# Patient Record
Sex: Female | Born: 1960 | Race: Black or African American | Hispanic: No | Marital: Single | Smoking: Never smoker
Health system: Southern US, Community
[De-identification: ages and names within clinical notes are randomized; demographics above are authoritative.]

## PROBLEM LIST (undated history)

## (undated) DIAGNOSIS — M1990 Unspecified osteoarthritis, unspecified site: Secondary | ICD-10-CM

## (undated) DIAGNOSIS — I10 Essential (primary) hypertension: Secondary | ICD-10-CM

## (undated) DIAGNOSIS — F32A Depression, unspecified: Secondary | ICD-10-CM

## (undated) DIAGNOSIS — F329 Major depressive disorder, single episode, unspecified: Secondary | ICD-10-CM

## (undated) DIAGNOSIS — F419 Anxiety disorder, unspecified: Secondary | ICD-10-CM

## (undated) DIAGNOSIS — G8929 Other chronic pain: Secondary | ICD-10-CM

## (undated) DIAGNOSIS — H9190 Unspecified hearing loss, unspecified ear: Secondary | ICD-10-CM

## (undated) DIAGNOSIS — M25561 Pain in right knee: Secondary | ICD-10-CM

## (undated) HISTORY — PX: CHOLECYSTECTOMY: SHX55

## (undated) HISTORY — PX: HERNIA REPAIR: SHX51

## (undated) HISTORY — DX: Unspecified hearing loss, unspecified ear: H91.90

## (undated) HISTORY — PX: WISDOM TOOTH EXTRACTION: SHX21

## (undated) HISTORY — PX: UTERINE FIBROID SURGERY: SHX826

---

## 2000-07-04 ENCOUNTER — Ambulatory Visit (HOSPITAL_BASED_OUTPATIENT_CLINIC_OR_DEPARTMENT_OTHER): Admission: RE | Admit: 2000-07-04 | Discharge: 2000-07-04 | Payer: Self-pay | Admitting: Orthopedic Surgery

## 2002-08-12 ENCOUNTER — Other Ambulatory Visit: Admission: RE | Admit: 2002-08-12 | Discharge: 2002-08-12 | Payer: Self-pay | Admitting: Obstetrics & Gynecology

## 2006-04-14 ENCOUNTER — Emergency Department (HOSPITAL_COMMUNITY): Admission: EM | Admit: 2006-04-14 | Discharge: 2006-04-14 | Payer: Self-pay | Admitting: Family Medicine

## 2006-04-19 ENCOUNTER — Emergency Department (HOSPITAL_COMMUNITY): Admission: EM | Admit: 2006-04-19 | Discharge: 2006-04-19 | Payer: Self-pay | Admitting: Family Medicine

## 2006-05-14 HISTORY — PX: ABDOMINAL HYSTERECTOMY: SHX81

## 2006-06-05 ENCOUNTER — Ambulatory Visit (HOSPITAL_COMMUNITY): Admission: RE | Admit: 2006-06-05 | Discharge: 2006-06-05 | Payer: Self-pay | Admitting: Obstetrics and Gynecology

## 2006-07-30 ENCOUNTER — Inpatient Hospital Stay (HOSPITAL_COMMUNITY): Admission: RE | Admit: 2006-07-30 | Discharge: 2006-08-04 | Payer: Self-pay | Admitting: General Surgery

## 2006-07-30 ENCOUNTER — Encounter (INDEPENDENT_AMBULATORY_CARE_PROVIDER_SITE_OTHER): Payer: Self-pay | Admitting: Specialist

## 2007-02-04 ENCOUNTER — Encounter: Admission: RE | Admit: 2007-02-04 | Discharge: 2007-02-04 | Payer: Self-pay | Admitting: General Surgery

## 2007-07-30 ENCOUNTER — Encounter: Admission: RE | Admit: 2007-07-30 | Discharge: 2007-07-30 | Payer: Self-pay | Admitting: General Surgery

## 2010-09-29 NOTE — H&P (Signed)
[name], [name]              ACCOUNT NO.:  [id number]   MEDICAL RECORD NO.:  [id number]        PATIENT TYPE:  LINP   LOCATION:                               FACILITY:  [facility]   PHYSICIAN:  Dineen Kid. Rana Snare, M.D.    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  She also has an abdominal mass and also gallbladder  disease and is followed by Dr. Abbey Chatters.  She had originally been  referred to me in December, 2007 for original evaluation of large  fibroids and an ovarian cyst found on ultrasound.  CAT scan as well as  ultrasounds have shown a 10-12 week size fibroid uterus.  Her left ovary  measured 5.7 cm in size, which is suspicious for dermoid tumor; however,  she also has a 10 cm complex cystic mass to the right of the umbilicus,  which we do not think is connected to the ovary, which Dr. 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  Her  pain has been  more from the umbilical cyst.  She had a normal CA-125.  Has no further childbearing desires.  Presents for a hysterectomy with  removal of both tubes and ovaries as well as removal of the abdominal  mass and also gallbladder by Dr. Abbey Chatters.   PAST MEDICAL HISTORY:  Significant for being deaf and mute.  She is very  adept at sign language.   PAST SURGICAL HISTORY:  Significant for wrist surgery and also wisdom  teeth.   OB HISTORY:  She has had a vaginal delivery of a 6 pound baby.   She reports an allergy to PENICILLIN as a child, for which she had a  rash.  Her mom and [name] both think she can take amoxicillin without  problems.   PHYSICAL EXAMINATION:  VITAL SIGNS:  Her  blood pressure today was  elevated at 152/96.  Her weight is 282.  HEART: Regular rate and rhythm.  LUNGS:  Clear to auscultation bilaterally.  ABDOMEN:  Obese.  She has a 10 cm mass at the right of the midline.  It  feels like an umbilical hernia.  According to the CAT scan, it most  likely represents urachal cyst.  Mildly tender to deep palpation.  PELVIC:  The pelvic exam is somewhat limited due to her size.  She does  have normal external genitalia, Bartholin's, urethra, and Skene's.  Uterus is anteverted and slightly enlarged, approximately 8-10 weeks  size and nontender.   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  Discussed the procedures at length.  We  are going to proceed with the abdominal mass, first with Dr. Abbey Chatters,  and possible gallbladder first.  If it looks  feasible to proceed  laparoscopically, we will proceed with a laparoscopically assisted  vaginal hysterectomy with bilateral salpingo-oophorectomy.  If it does  not appear that we are going to be able to proceed laparoscopically,  then we will proceed with a total abdominal hysterectomy with bilateral  salpingo-oophorectomy.  I discussed the risks and benefits of the  procedure at length, which include but are  limited to, the risk of infection, bleeding, damage to the bowel,  bladder, ureters, risks associated with blood transfusion, anesthesia,  discussed recovery time, and also discussed hormone replacement therapy.  All of her questions were answered.  She gives her informed consent.      Dineen Kid Rana Snare, M.D.  Electronically Signed     DCL/MEDQ  D:  07/29/2006  T:  07/29/2006  Job:  [id number]

## 2010-09-29 NOTE — Op Note (Signed)
[name], [name]              ACCOUNT NO.:  [id number]   MEDICAL RECORD NO.:  [id number]          PATIENT TYPE:  INP   LOCATION:  1608                         FACILITY[facility]   PHYSICIAN:  Dineen Kid. Rana Snare, M.D.    DATE OF BIRTH:  08/21/1960   DATE OF PROCEDURE:  07/30/2006  DATE OF DISCHARGE:                               OPERATIVE REPORT   PREOPERATIVE DIAGNOSIS:  Menorrhagia, fibroids, pelvic pain, left  ovarian mass, abdominal mass, symptomatic cholelithiasis.   POSTOPERATIVE DIAGNOSIS:  Menorrhagia, fibroids, pelvic pain, left  ovarian mass, abdominal mass, symptomatic cholelithiasis, plus pelvic  abdominal adhesions.   SURGEON:  Dineen Kid. Rana Snare, M.D.   Mammie LorenzoAdolph Pollack, M.D.   INDICATIONS:  [name] is a 50 year old G1, P1 with worsening  abdominal pain, pelvic pain, menorrhagia.  She had a CAT scan which  shows a 10 cm abdominal mass, which she is complaining of abdominal  pain.  She also has symptomatic cholelithiasis but also has a left 5.5  cm right ovarian mass, consistent with a dermoid.  She has also had  menorrhagia and fibroids, approximately 10 weeks size.  She desires  surgical intervention for all of the above and presents for evaluation  and removal of abdominal mass, gallbladder, uterus, tubes, and ovaries.  The risks and benefits of the procedure were discussed at length.  See  history and physical for further details.   FINDINGS:  Abdominal mass, further dictation by Dr. Abbey Chatters, with  dense omental and bowel adhesions to the anterior abdominal wall.  Pelvis had a 10-week size fibroid uterus with adhesions from the omentum  and the large bowel to the posterior wall, left ovarian cyst consistent  with a dermoid, approximately 6 cm in size.  Ovary appears to be normal  other than adhesions to the pelvic side wall.  The right fallopian tube  appears to have a hydrosalpinx.  Multiple fibroids throughout the  uterus.   DESCRIPTION OF  PROCEDURE:  Laparoscopic cholecystectomy was carried out  and was dictated by Dr. Abbey Chatters.  I placed the laparoscope through  the 10 mm scope to the left of the midline.  I placed a 5 mm trocar to  the left of the midline and after careful evaluation of the pelvis,  realizing there were a significant amount of adhesions to the uterus  from the omentum to the pelvic side wall and the uterus was  significantly large enough that this would not be able to be completed  laparoscopically assisted vaginal.  Furthermore, because of the  abdominal mass significant enough in adhesions and adhesions, she was  going to require a midline incision.  We elected at this point to not  proceed with the LAVH but proceed with a midline incision and a total  abdominal hysterectomy.  A midline incision was reflected laterally  after the fascia was dissected.  The peritoneum was entered sharply.  Careful dissection of the abdominal wall mass was carried out and  dissection of the bowel and cecectomy.  These were dictated by Dr.  Abbey Chatters.  At this point, the patient was  placed in Trendelenburg.  A  Balfour retractor was placed.  The bowel was packed cephalad.  Meticulous dissection of the bowel and the omentum off the uterus  posterior cul de sac and the ovaries was carried out.  The left ovary  was grasped with a Babcock clamp.  The infundibulopelvic ligament was  identified.  A LigaSure instrument was used to ligate and dissect across  the infundibulopelvic ligament, across the round ligament, and the  superior portion of the broad ligament.  The right infundibulopelvic  ligament was identified, ligated and dissected using the LigaSure.  The  round ligament was identified, ligated, and the superior portion of the  broad ligament was identified and ligated.  The bladder was dissected  off the anterior cervix.  The LigaSure was placed across the uterine  vasculature bilaterally, ligated, and dissected.   Because of the  significant amount of fibroids, at this point the corpus was removed  from the cervix using a scalpel.  A Kocher tenaculum was placed on the  cervix.  The bladder was dissected off the anterior surface of the  cervix.  Heaney clamps were used to clamp across the cardinal ligaments  down to the uterosacral ligaments.  The LigaSure instrument was used to  ligate along these pedicles, dissect down to the corner of the vagina.  A Heaney clamp was placed across the corner of the vagina, and the  Satinsky scissors were used to remove the cervix.  Examination of the  cervix revealed the entire cervix removed.  The vagina was then closed  with figure-of-eight sutures of 0 Monocryl suture with a good  approximation and good hemostasis achieved.  Examination of the pedicles  did have some problems with hemostasis with venous bleeding.  The vein  was grasped and ligated with LigaSure.  Because of some generalized  oozing below the base of the bladder, __________  was used to achieve  good hemostasis at this point.  The ureters were identified manually  away from the pedicles, and good peristalsis was noted.  No blood was  noted in the urine.  Good urine had been produced.  After a copious  amount of irrigation, re-examination of the pelvis and cul de sac  revealed good hemostasis.  The abdomen was copiously irrigated, and the  midline incision and trocar incisions were closed and will be dictated  in Dr. Maris Berger operative note.  Patient tolerated the procedure  well and was stable on transfer to the recovery room.  The sponge and  instrument count was normal x3.  The patient received 900 mg of  Clindamycin preoperatively.  Operative procedure was laparoscopic  cholecystectomy, intraoperative cholangiogram, laparotomy with excision  of abdominal mass, lysis of adhesions, total abdominal hysterectomy, bilateral salpingo-oophorectomy, and cecectomy.  Estimated blood loss  was 1000  cc.      Dineen Kid Rana Snare, M.D.  Electronically Signed     DCL/MEDQ  D:  07/30/2006  T:  07/31/2006  Job:  [id number]

## 2010-09-29 NOTE — Op Note (Signed)
[name], RAIMER              ACCOUNT NO.:  [id number]   MEDICAL RECORD NO.:  [id number]          PATIENT TYPE:  INP   LOCATION:  1608                         FACILITY:  [facility]   PHYSICIAN:  Adolph Pollack, M.D.DATE OF BIRTH:  04-13-1961   DATE OF PROCEDURE:  07/30/2006  DATE OF DISCHARGE:                               OPERATIVE REPORT   PREOPERATIVE DIAGNOSES:  1. Menorrhagia with pelvic pain with left ovarian mass.  2. Symptomatic cholelithiasis.  3. Abdominal mass.   POSTOPERATIVE DIAGNOSES:  1. Menorrhagia with pelvic pain with left ovarian mass.  2. Symptomatic cholelithiasis.  3. Abdominal mass.   PROCEDURE:  1. Laparoscopic cholecystectomy with intraoperative cholangiogram.  2. Exploratory laparotomy with excision of abdominal mass and      cecectomy.  3. Total abdominal hysterectomy, bilateral salpingo-oophorectomy and      lysis of adhesions.   SURGEON:  Adolph Pollack, M.D.   COSURGEON:  Dr. Candice Camp.   ANESTHESIA:  General.   INDICATIONS:  This is a 50 year old female who has had an ovarian mass,  fibroids and abnormal uterine bleeding.  She has also had some right-  sided abdominal pain, was found to have gallstones and the cystic  abdominal mass.  She now presents for the above procedure.  We have gone  over the procedure and risks preoperatively.   TECHNIQUE:  She is seen in the holding area and brought to the operating  room, placed supine on the operating table and a general anesthetic was  administered.  A Foley catheter was inserted.  She is placed in the  lithotomy position.  The abdominal wall and perineal area were sterilely  prepped and draped.  I could palpate the abdominal mass in the  periumbilical area.  A small incision was made in the left upper  quadrant.  A 5 mm OptiVu trocar was used under laparoscopic vision.  I  was able to enter the peritoneal cavity using the OptiVu.  I immediately  inspected the area underneath the  trocar and no bleeding or bowel injury  was noted.  Pneumoperitoneum was then created by insufflation of CO2  gas.  The laparoscope was introduced and there were significant omental  adhesions around the periumbilical region as well as a cystic-appearing  mass.  In the epigastrium to the left of the midline a 10 mm trocar was  placed away from the abdominal mass.  A 10 mm trocar was placed in the  subxiphoid region and two 5 mm trocars were then placed in the right mid  abdomen and we directed our attention to the gallbladder.   The fundus of the gallbladder was grasped and retracted toward the right  shoulder.  No significant adhesions were noted.  The infundibulum was  then mobilized with blunt dissection and retracted laterally.  Using  careful blunt dissection the cystic artery was identified and was fairly  close to the cystic duct.  I created a window around both of these  structures.  The cystic artery was then clipped and divided.  A clip was  placed at the gallbladder cystic duct junction  and a small incision made  in the cystic duct.  A cholangiocatheter was passed through the anterior  abdominal wall placed into the cystic duct and cholangiogram was  performed.   Under real time fluoroscopy, dilute contrast was injected to the cystic  duct which was of moderate length.  The common hepatic, right and left  hepatic, common bile ducts all opacified and contrast drained promptly  into the duodenum with no obvious evidence of obstruction.  Final  reports pending radiologist's interpretation.   Cholangiocatheter was removed, the cystic duct was then clipped four  times proximally and divided.  I then dissected the gallbladder free  from the liver bed intact and identified a small posterior branch of the  cystic artery which was clipped.  The gallbladder was placed in  Endopouch bag.  The gallbladder fossa was irrigated and was hemostatic.  Irrigation fluid was evacuated.    Following this I began using Harmonic scalpel and dissect some of the  omental adhesions free from the abdominal wall and tried to mobilize  this abdominal mass that appeared be fairly adherent to the abdominal  wall.  I did this for approximately 30 minutes.  Dr. Rana Snare was going to  attempt to do a laparoscopic-assisted vaginal hysterectomy.  After 30  minutes I realized I probably would have to potentially open through a  small open incision.  So he attempted to do a laparoscopic-assisted  vaginal hysterectomy but she had some inflammatory changes in the pelvis  as well making it difficult.  We subsequently decided to open and do  exploratory laparotomy.   The CO2 gas was released.  I then made a lower midline incision and  extended this to the left of the umbilicus, dividing the skin and  subcutaneous tissue and fascia.  These the mass was then immediately  adherent to the posterior abdominal wall.  I carefully dissected the  mass off the posterior abdominal wall leaving the anterior fascial layer  but having to take some of the posterior fascia.  There are some small  bowel adhesions to the mass that I divided sharply.  It appeared be  somewhat of an inflammatory masses and there was significant  inflammatory reaction of the abdominal wall.  There were two small holes  placed in the mass and some brown slightly foul-smelling fluid was  evacuated and was sent for aerobic and anaerobic cultures.  I then  continued to excise the mass off the abdominal wall and then noted that  the mass was very close to the cecum.  I could not definitely find the  appendix and felt this may be involved with the mass.  Once I had  removed the mass, there was a small area where the appendiceal orifice  appeared.  I then dissected the terminal ileum away from the cecal area.  There was a firm area in the cecum which appeared to be inflammatory nature.  By using the GIA stapler, I performed a cecectomy  and  sent  this off the field.  Frozen section analysis of the cyst was consistent  with an inflammatory mass.   Following this I checked the ileocecal valve and it was patent.  Bleeding from the staple line was controlled with a suture.  Gloves were  then changed.   Dr. Rana Snare then performed lysis of pelvic adhesions and a TAH/BSO to be  dictated in his note.   After completion of all procedures, the abdominal cavity was copiously  irrigated with  saline solution.  I requested sponge count which was  reported be correct.  The midline fascia was then closed with a running  #1 Novofil suture.  The subcutaneous tissue was irrigated.  At this point, instrument and  sponge counts were reported to be correct again.  The skin incisions  were then closed with staples and sterile dressings were applied.   She tolerated procedure without any apparent complications and was taken  to recovery in satisfactory condition.      Adolph Pollack, M.D.  Electronically Signed     TJR/MEDQ  D:  07/30/2006  T:  07/31/2006  Job:  [id number]   cc:   Dineen Kid. Rana Snare, M.D.  Fax: [phone]   Candyce Churn, M.D.  Fax: [phone]

## 2010-09-29 NOTE — Op Note (Signed)
[facility]  Patient:    [name], [name]                     MRN: [id number] Proc. Date: 07/04/00 Adm. Date:  [id number] Attending:  Ronne Binning CC:         [facility] (2 copies)   Operative Report  PREOPERATIVE DIAGNOSIS:       Carpometacarpal instability, right thumb.  POSTOPERATIVE DIAGNOSIS:      Carpometacarpal instability, right thumb.  OPERATION:                    Eaton stabilization, carpometacarpal right                               thumb.  SURGEON:                      Nicki Reaper, M.D.  ANESTHESIA:                   General.  ANESTHESIOLOGIST:             Halford Decamp, M.D.  HISTORY:                      The patient is a 50 year old female with a history of an injury to her right thumb with instability at carpometacarpal joint. This did not respond to conservative treatment.  DESCRIPTION OF PROCEDURE:     The patient was brought to the operating room where a general anesthetic was carried out without difficulty. She was prepped and draped using Betadine scrub and solution. With the right arm free, the limb was exsanguinated with an Esmarch bandage, tourniquet placed high, and the arm was inflated to 250 mmHg. A hockey-stick incision was made over the flexor carpi radialis and carried dorsally at the carpometacarpal joint of the thumb and carried down through subcutaneous tissue. Bleeders were electrocauterized. The dissection was carried down to the flexor carpi radialis sheath. The superficial branch of the radial artery was protected. Dissection was carried distally elevating the intrinsic muscles of the thumb off from the trapezium and then at the base of the first metacarpal. The dissection was then carried dorsally to the abductor pollicis longus insertion. A drill hole was then placed across the base of the metacarpal of the thumb extending at the volar beat. One half of the flexor carpi  radialis tendon was then harvested, left attached to the base of the index metacarpal. This was then passed through the drill hole beneath the abductor pollicis longus tendon, brought over to the flexor carpi radialis, although looped around it and sutured to itself. This allowed the joint to be fully stabilized after snugging this. The wound was irrigated. The suturing was done with figure-of-eight 4-0 Mersilene sutures. The proximal flexor carpi radialis sheath was closed with interrupted figure-of-eight 4-0 Mersilene sutures. The intrinsic muscles were then reapproximated with interrupted figure-of-eight 4-0 Vicryl sutures. The subcutaneous tissue was closed with 4-0 Vicryl and the skin with interrupted 5-0 nylon sutures. A surgical pressure dressing and thumb-spica splint was applied. The patient tolerated the procedure well and was taken to the recovery room for observation in satisfactory condition. She is discharged home to return to [facility] [place] in one week, on Vicodin. It should be noted she was given vancomycin in the operating room. DD:  07/04/00 TD:  07/04/00 Job: [id number]

## 2010-09-29 NOTE — Discharge Summary (Signed)
[name], [name]              ACCOUNT NO.:  [id number]   MEDICAL RECORD NO.:  [id number]          PATIENT TYPE:  INP   LOCATION:  1608                         [facility]ACILITY[facility]   PHYSICIAN:  Adolph Pollack, M.D.DATE OF BIRTH:  May 06, 1961   DATE OF ADMISSION:  07/30/2006  DATE OF DISCHARGE:  08/04/2006                               DISCHARGE SUMMARY   PRINCIPAL DISCHARGE DIAGNOSES:  1. Carcinoid tumor of the appendix.  2. Chronic cholecystitis and cholelithiasis.  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  4. Uterine leiomyomata.  5. Acute blood loss anemia.  6. Postoperative ileus.   PROCEDURE:  Laparoscopic cholecystectomy with intraoperative  cholangiogram, exploratory laparotomy with excision of abdominal mass  and cecectomy, TAH/BSO, lysis of adhesions; this was July 30, 2006.   REASON FOR ADMISSION:  [name] is a 50 year old female who was found  to have an ovarian mass, abnormal uterine bleeding, pelvic pain.  She  also had cholelithiasis and is found to have a cystic abdominal mass of  unknown origin that is palpable.  She was admitted for the above  procedures.   HOSPITAL COURSE:  She went through the above procedures.  Preoperatively  she had some anemia and postoperatively she had anemia as well but was  hemodynamically stable with good urine output.  Intraoperatively there  was concern that the intra-abdominal mass was potentially an abscess, so  she was started on IV Avelox for treatment.  She had a low-grade fever  and her hemoglobin dropped to 6.8 at its lowest but she tolerated this  well.  She was started on iron and her diet was slowly advanced.  Her  bowel function returned.  Her pathology came back a 1.9-cm appendiceal  carcinoid near the base of the cecum with questionable lymphovascular  invasion and questionable perforation.  She subsequently had resumption  of bowel activity and wound looked good.  By August 04, 2006, she was  able to be  discharged.   DISPOSITION:  Discharged August 04, 2006, in satisfactory condition.  She  will come back in 3-4 weeks for staple removal.  She is discharged on  Avelox, Vicodin and iron.  She will have her anemia followed up as an  outpatient.  She was given activity and dietary restrictions.      Adolph Pollack, M.D.  Electronically Signed     TJR/MEDQ  D:  09/02/2006  T:  09/02/2006  Job:  [id number]

## 2014-01-25 ENCOUNTER — Ambulatory Visit (INDEPENDENT_AMBULATORY_CARE_PROVIDER_SITE_OTHER): Payer: Federal, State, Local not specified - PPO | Admitting: Family Medicine

## 2014-01-25 ENCOUNTER — Ambulatory Visit (HOSPITAL_BASED_OUTPATIENT_CLINIC_OR_DEPARTMENT_OTHER)
Admission: RE | Admit: 2014-01-25 | Discharge: 2014-01-25 | Disposition: A | Discharge status: Home / Self Care | Payer: Federal, State, Local not specified - PPO | Source: Ambulatory Visit | Attending: Family Medicine | Admitting: Family Medicine

## 2014-01-25 VITALS — BP 138/80 | HR 106 | Temp 97.5°F | Resp 18 | Ht 65.0 in | Wt 346.0 lb

## 2014-01-25 DIAGNOSIS — L03119 Cellulitis of unspecified part of limb: Secondary | ICD-10-CM

## 2014-01-25 DIAGNOSIS — L02419 Cutaneous abscess of limb, unspecified: Secondary | ICD-10-CM

## 2014-01-25 DIAGNOSIS — M25569 Pain in unspecified knee: Secondary | ICD-10-CM | POA: Diagnosis present

## 2014-01-25 DIAGNOSIS — R609 Edema, unspecified: Secondary | ICD-10-CM | POA: Insufficient documentation

## 2014-01-25 LAB — POCT CBC
Granulocyte percent: 70.4 %G (ref 37–80)
HEMATOCRIT: 45.6 % (ref 37.7–47.9)
Hemoglobin: 14.1 g/dL (ref 12.2–16.2)
LYMPH, POC: 2.5 (ref 0.6–3.4)
MCH, POC: 26.6 pg — AB (ref 27–31.2)
MCHC: 30.9 g/dL — AB (ref 31.8–35.4)
MCV: 86.1 fL (ref 80–97)
MID (cbc): 0.4 (ref 0–0.9)
MPV: 8.2 fL (ref 0–99.8)
PLATELET COUNT, POC: 265 10*3/uL (ref 142–424)
POC Granulocyte: 7 — AB (ref 2–6.9)
POC LYMPH %: 25.1 % (ref 10–50)
POC MID %: 4.5 % (ref 0–12)
RBC: 5.29 M/uL (ref 4.04–5.48)
RDW, POC: 15.5 %
WBC: 10 10*3/uL (ref 4.6–10.2)

## 2014-01-25 LAB — POCT SEDIMENTATION RATE: POCT SED RATE: 21 mm/hr (ref 0–22)

## 2014-01-25 MED ORDER — FUROSEMIDE 40 MG PO TABS: 40.0000 mg | ORAL_TABLET | Freq: Every day | ORAL | Status: DC

## 2014-01-25 MED ORDER — POTASSIUM CHLORIDE CRYS ER 20 MEQ PO TBCR
20.0000 meq | EXTENDED_RELEASE_TABLET | Freq: Every day | ORAL | Status: DC
Start: 2014-01-25 — End: 2014-01-28

## 2014-01-25 MED ORDER — DOXYCYCLINE HYCLATE 100 MG PO CAPS: 100.0000 mg | ORAL_CAPSULE | Freq: Two times a day (BID) | ORAL | Status: DC

## 2014-01-25 NOTE — Progress Notes (Addendum)
Subjective:  This chart was scribed for April Sorenson, MD, by Elon Spanner, ED Scribe. This patient was seen in room Rm 1 and the patient's care was started at 6:16 PM.   Patient ID: [name], female    DOB: [birth year], 53 y.o.   MRN: [id number] Chief Complaint  Patient presents with  . Leg Swelling    r leg 6 days    HPI HPI Comments: [name] is a 53 y.o. female who presents to [facility] complaining of right lower leg swelling and pain below the knee onset 1 week ago.  Patient reports difficulty sleeping due to the pain.  Patient reports she had a negative ultrasound performed several months ago on suspicion of DVT. Patient reports that this episode is somewhat similar.  Patient has not tried anything to reduce the swelling.  Patient does not take any medications currently.  Patient has a history of a lateral ankle implant on the right.  Patient denies injury, insect bite.  Past Medical History  Diagnosis Date  . Deaf    No current outpatient prescriptions on file prior to visit.   No current facility-administered medications on file prior to visit.   Allergies  Allergen Reactions  . Penicillins Rash     Review of Systems  Constitutional: Negative for diaphoresis.  Respiratory: Negative for cough and shortness of breath.   Cardiovascular: Positive for leg swelling. Negative for chest pain.  Musculoskeletal: Positive for myalgias.  Neurological: Negative for weakness and headaches.       Objective:  BP 138/80  Pulse 106  Temp(Src) 97.5 F (36.4 C) (Oral)  Resp 18  Ht  (1.651 m)  Wt 346 lb (156.945 kg)  BMI 57.58 kg/m2  SpO2 98%  Physical Exam  Nursing note and vitals reviewed. Constitutional: She is oriented to person, place, and time. She appears well-developed and well-nourished. No distress.  HENT:  Head: Normocephalic and atraumatic.  Eyes: Conjunctivae and EOM are normal.  Neck: Neck supple. No tracheal deviation present.  Cardiovascular:  Normal rate.   Pulmonary/Chest: Effort normal. No respiratory distress.  Musculoskeletal: Normal range of motion. She exhibits tenderness.  Painful, warmth over medial aspect.  2+ pitting edema in right.  3+ pitting edema in left.  Pedal pulses not palpable.   Right leg is 19.25" in diameter.  Left leg is 18.1" inches in diameter.  Measurement was taken 11" vertical from the bottom of the heal.     Neurological: She is alert and oriented to person, place, and time.  Skin: Skin is warm and dry.  Psychiatric: She has a normal mood and affect. Her behavior is normal.    Results for orders placed in visit on 01/25/14  POCT CBC      Result Value Ref Range   WBC 10.0  4.6 - 10.2 K/uL   Lymph, poc 2.5  0.6 - 3.4   POC LYMPH PERCENT 25.1  10 - 50 %L   MID (cbc) 0.4  0 - 0.9   POC MID % 4.5  0 - 12 %M   POC Granulocyte 7.0 (*) 2 - 6.9   Granulocyte percent 70.4  37 - 80 %G   RBC 5.29  4.04 - 5.48 M/uL   Hemoglobin 14.1  12.2 - 16.2 g/dL   HCT, POC 82.9  56.2 - 47.9 %   MCV 86.1  80 - 97 fL   MCH, POC 26.6 (*) 27 - 31.2 pg   MCHC 30.9 (*) 31.8 -  35.4 g/dL   RDW, POC 04.5     Platelet Count, POC 265  142 - 424 K/uL   MPV 8.2  0 - 99.8 fL  POCT SEDIMENTATION RATE      Result Value Ref Range   POCT SED RATE 21  0 - 22 mm/hr       Assessment & Plan:  6:30 PM Advised patient that right lower extremity venous doppler will have to be performed to r/o DVT.  Informed patient that this can be done more easily at [facility].  Patient will be given directions after she is scheduled to be seen there.  Patient requests that she have an interpreter at [employer] but we were unable to arrange this after business hours. 8:42 PM  Informed of negative results of DVT by tech and [facility] who also informed pt. Oow note given for tonight - start below meds, get compression hose, recheck w/ me - "scheduled" appt for f/u at the [facility] for Thurs 9/17 at 10 a.m. So that  interpreter could meet pt here for this - Denny Peon was able to arrange through [facility].   Suspect sxs are more due to venous stasis so start lasix and compression hose but as Rt leg more tender and warm, will cover w/ doxy.  Edema - Plan: US Venous Img Lower Unilateral Right, Basic metabolic panel  Cellulitis and abscess of leg - Plan: POCT CBC, POCT SEDIMENTATION RATE, US Venous Img Lower Unilateral Right, Basic metabolic panel  Meds ordered this encounter  Medications  . doxycycline (VIBRAMYCIN) 100 MG capsule    Sig: Take 1 capsule (100 mg total) by mouth 2 (two) times daily.    Dispense:  20 capsule    Refill:  0  . furosemide (LASIX) 40 MG tablet    Sig: Take 1 tablet (40 mg total) by mouth daily.    Dispense:  30 tablet    Refill:  0  . potassium chloride SA (K-DUR,KLOR-CON) 20 MEQ tablet    Sig: Take 1 tablet (20 mEq total) by mouth daily.    Dispense:  30 tablet    Refill:  0    I personally performed the services described in this documentation, which was scribed in my presence. The recorded information has been reviewed and considered, and addended by me as needed.  April Sorenson, MD MPH

## 2014-01-25 NOTE — Patient Instructions (Addendum)
Elevate your legs as much as possible.  You can get a pair of compression hose at a medical supply store which can help push the swelling and fluid back into your system.  Drink LOTS of water but do not eat salt.  Take the 3 medications as prescribed.  Lets recheck with me on Thursday 9/17 with a sign language interpreter - we will need to recheck your  Blood labs at that time as well.  The doxycycline will treat any infection in the legs and the lasix will help get the swelling out of your legs - it can lower your potassium which makes your muscles ache so that is why you need to take the prescription potassium with it.  Peripheral Edema You have swelling in your legs (peripheral edema). This swelling is due to excess accumulation of salt and water in your body. Edema may be a sign of heart, kidney or liver disease, or a side effect of a medication. It may also be due to problems in the leg veins. Elevating your legs and using special support stockings may be very helpful, if the cause of the swelling is due to poor venous circulation. Avoid long periods of standing, whatever the cause. Treatment of edema depends on identifying the cause. Chips, pretzels, pickles and other salty foods should be avoided. Restricting salt in your diet is almost always needed. Water pills (diuretics) are often used to remove the excess salt and water from your body via urine. These medicines prevent the kidney from reabsorbing sodium. This increases urine flow. Diuretic treatment may also result in lowering of potassium levels in your body. Potassium supplements may be needed if you have to use diuretics daily. Daily weights can help you keep track of your progress in clearing your edema. You should call your caregiver for follow up care as recommended. SEEK IMMEDIATE MEDICAL CARE IF:   You have increased swelling, pain, redness, or heat in your legs.  You develop shortness of breath, especially when lying down.  You  develop chest or abdominal pain, weakness, or fainting.  You have a fever. Document Released: 06/07/2004 Document Revised: 07/23/2011 Document Reviewed: 05/18/2009 [facility] Patient Information 2015 [place], [place]. This information is not intended to replace advice given to you by your health care provider. Make sure you discuss any questions you have with your health care provider.  Edema Edema is an abnormal buildup of fluids in your bodytissues. Edema is somewhatdependent on gravity to pull the fluid to the lowest place in your body. That makes the condition more common in the legs and thighs (lower extremities). Painless swelling of the feet and ankles is common and becomes more likely as you get older. It is also common in looser tissues, like around your eyes.  When the affected area is squeezed, the fluid may move out of that spot and leave a dent for a few moments. This dent is called pitting.  CAUSES  There are many possible causes of edema. Eating too much salt and being on your feet or sitting for a long time can cause edema in your legs and ankles. Hot weather may make edema worse. Common medical causes of edema include:  Heart failure.  Liver disease.  Kidney disease.  Weak blood vessels in your legs.  Cancer.  An injury.  Pregnancy.  Some medications.  Obesity. SYMPTOMS  Edema is usually painless.Your skin may look swollen or shiny.  DIAGNOSIS  Your health care provider may be able to diagnose edema  by asking about your medical history and doing a physical exam. You may need to have tests such as X-rays, an electrocardiogram, or blood tests to check for medical conditions that may cause edema.  TREATMENT  Edema treatment depends on the cause. If you have heart, liver, or kidney disease, you need the treatment appropriate for these conditions. General treatment may include:  Elevation of the affected body part above the level of your heart.  Compression of the  affected body part. Pressure from elastic bandages or support stockings squeezes the tissues and forces fluid back into the blood vessels. This keeps fluid from entering the tissues.  Restriction of fluid and salt intake.  Use of a water pill (diuretic). These medications are appropriate only for some types of edema. They pull fluid out of your body and make you urinate more often. This gets rid of fluid and reduces swelling, but diuretics can have side effects. Only use diuretics as directed by your health care provider. HOME CARE INSTRUCTIONS   Keep the affected body part above the level of your heart when you are lying down.   Do not sit still or stand for prolonged periods.   Do not put anything directly under your knees when lying down.  Do not wear constricting clothing or garters on your upper legs.   Exercise your legs to work the fluid back into your blood vessels. This may help the swelling go down.   Wear elastic bandages or support stockings to reduce ankle swelling as directed by your health care provider.   Eat a low-salt diet to reduce fluid if your health care provider recommends it.   Only take medicines as directed by your health care provider. SEEK MEDICAL CARE IF:   Your edema is not responding to treatment.  You have heart, liver, or kidney disease and notice symptoms of edema.  You have edema in your legs that does not improve after elevating them.   You have sudden and unexplained weight gain. SEEK IMMEDIATE MEDICAL CARE IF:   You develop shortness of breath or chest pain.   You cannot breathe when you lie down.  You develop pain, redness, or warmth in the swollen areas.   You have heart, liver, or kidney disease and suddenly get edema.  You have a fever and your symptoms suddenly get worse. MAKE SURE YOU:   Understand these instructions.  Will watch your condition.  Will get help right away if you are not doing well or get  worse. Document Released: 04/30/2005 Document Revised: 09/14/2013 Document Reviewed: 02/20/2013 [facility] Patient Information 2015 [place], [place]. This information is not intended to replace advice given to you by your health care provider. Make sure you discuss any questions you have with your health care provider. Cellulitis Cellulitis is an infection of the skin and the tissue beneath it. The infected area is usually red and tender. Cellulitis occurs most often in the arms and lower legs.  CAUSES  Cellulitis is caused by bacteria that enter the skin through cracks or cuts in the skin. The most common types of bacteria that cause cellulitis are staphylococci and streptococci. SIGNS AND SYMPTOMS   Redness and warmth.  Swelling.  Tenderness or pain.  Fever. DIAGNOSIS  Your health care provider can usually determine what is wrong based on a physical exam. Blood tests may also be done. TREATMENT  Treatment usually involves taking an antibiotic medicine. HOME CARE INSTRUCTIONS   Take your antibiotic medicine as directed by your  health care provider. Finish the antibiotic even if you start to feel better.  Keep the infected arm or leg elevated to reduce swelling.  Apply a warm cloth to the affected area up to 4 times per day to relieve pain.  Take medicines only as directed by your health care provider.  Keep all follow-up visits as directed by your health care provider. SEEK MEDICAL CARE IF:   You notice red streaks coming from the infected area.  Your red area gets larger or turns dark in color.  Your bone or joint underneath the infected area becomes painful after the skin has healed.  Your infection returns in the same area or another area.  You notice a swollen bump in the infected area.  You develop new symptoms.  You have a fever. SEEK IMMEDIATE MEDICAL CARE IF:   You feel very sleepy.  You develop vomiting or diarrhea.  You have a general ill feeling (malaise)  with muscle aches and pains. MAKE SURE YOU:   Understand these instructions.  Will watch your condition.  Will get help right away if you are not doing well or get worse. Document Released: 02/07/2005 Document Revised: 09/14/2013 Document Reviewed: 07/16/2011 [facility] Patient Information 2015 [place], [place]. This information is not intended to replace advice given to you by your health care provider. Make sure you discuss any questions you have with your health care provider.

## 2014-01-26 ENCOUNTER — Telehealth: Payer: Self-pay

## 2014-01-26 LAB — BASIC METABOLIC PANEL
BUN: 15 mg/dL (ref 6–23)
CALCIUM: 9.4 mg/dL (ref 8.4–10.5)
CHLORIDE: 104 meq/L (ref 96–112)
CO2: 26 meq/L (ref 19–32)
CREATININE: 1.2 mg/dL — AB (ref 0.50–1.10)
GLUCOSE: 128 mg/dL — AB (ref 70–99)
POTASSIUM: 3.9 meq/L (ref 3.5–5.3)
SODIUM: 138 meq/L (ref 135–145)

## 2014-01-26 NOTE — Telephone Encounter (Signed)
MEDICATION GIVEN TOOLARGE FOR PATIENT TO SWALLOW. PLEASE CHANGE FOR PATIENT.  MEDICATION IS: KLOR-CON PRESCRIBED BY DR. SHAW ON BOTTLE.  THANK YOU.

## 2014-01-27 NOTE — Telephone Encounter (Signed)
That's fine. Will discuss w/ pt at her f/u OV tomorrow

## 2014-01-27 NOTE — Telephone Encounter (Signed)
Dr. Clelia Croft- I noticed there are no refills on this medication. If we do refill medication can we send potassium chloride 10 MEQ. These are a little smaller than the 20 MEQ's.   Returned call to pt- TTY (sign language interpreter) Pt may take large tablet easier after it has dissolved in water and drink it instead of swallowing the pill. Most potassium supplements are large in size. She may also crush and put into applesauce or pudding.

## 2014-01-28 ENCOUNTER — Encounter: Payer: Self-pay | Admitting: Family Medicine

## 2014-01-28 ENCOUNTER — Ambulatory Visit (INDEPENDENT_AMBULATORY_CARE_PROVIDER_SITE_OTHER): Payer: Federal, State, Local not specified - PPO | Admitting: Family Medicine

## 2014-01-28 VITALS — BP 120/80 | HR 91 | Temp 97.6°F | Resp 16 | Ht 66.0 in | Wt 342.0 lb

## 2014-01-28 DIAGNOSIS — Z79899 Other long term (current) drug therapy: Secondary | ICD-10-CM

## 2014-01-28 DIAGNOSIS — R7309 Other abnormal glucose: Secondary | ICD-10-CM

## 2014-01-28 DIAGNOSIS — R609 Edema, unspecified: Secondary | ICD-10-CM

## 2014-01-28 DIAGNOSIS — R7303 Prediabetes: Secondary | ICD-10-CM

## 2014-01-28 LAB — POCT CBC
GRANULOCYTE PERCENT: 70.8 % (ref 37–80)
HEMATOCRIT: 44.8 % (ref 37.7–47.9)
Hemoglobin: 14.3 g/dL (ref 12.2–16.2)
Lymph, poc: 1.6 (ref 0.6–3.4)
MCH: 26.8 pg — AB (ref 27–31.2)
MCHC: 31.9 g/dL (ref 31.8–35.4)
MCV: 83.9 fL (ref 80–97)
MID (CBC): 0.5 (ref 0–0.9)
MPV: 7.9 fL (ref 0–99.8)
PLATELET COUNT, POC: 247 10*3/uL (ref 142–424)
POC Granulocyte: 5.2 (ref 2–6.9)
POC LYMPH PERCENT: 22.2 %L (ref 10–50)
POC MID %: 7 %M (ref 0–12)
RBC: 5.34 M/uL (ref 4.04–5.48)
RDW, POC: 15.1 %
WBC: 7.3 10*3/uL (ref 4.6–10.2)

## 2014-01-28 LAB — COMPREHENSIVE METABOLIC PANEL
ALT: 10 U/L (ref 0–35)
AST: 11 U/L (ref 0–37)
Albumin: 4.1 g/dL (ref 3.5–5.2)
Alkaline Phosphatase: 77 U/L (ref 39–117)
BILIRUBIN TOTAL: 0.5 mg/dL (ref 0.2–1.2)
BUN: 14 mg/dL (ref 6–23)
CALCIUM: 9.9 mg/dL (ref 8.4–10.5)
CO2: 29 meq/L (ref 19–32)
Chloride: 103 mEq/L (ref 96–112)
Creat: 0.98 mg/dL (ref 0.50–1.10)
Glucose, Bld: 126 mg/dL — ABNORMAL HIGH (ref 70–99)
Potassium: 4.2 mEq/L (ref 3.5–5.3)
SODIUM: 140 meq/L (ref 135–145)
TOTAL PROTEIN: 7.1 g/dL (ref 6.0–8.3)

## 2014-01-28 LAB — POCT GLYCOSYLATED HEMOGLOBIN (HGB A1C): HEMOGLOBIN A1C: 6.2

## 2014-01-28 LAB — GLUCOSE, POCT (MANUAL RESULT ENTRY): POC Glucose: 142 mg/dl — AB (ref 70–99)

## 2014-01-28 LAB — POCT SEDIMENTATION RATE: POCT SED RATE: 26 mm/hr — AB (ref 0–22)

## 2014-01-28 MED ORDER — POTASSIUM CHLORIDE CRYS: 20.0000 meq | CRYSTALS | Freq: Two times a day (BID) | Status: DC

## 2014-01-28 MED ORDER — FUROSEMIDE 40 MG PO TABS: 40.0000 mg | ORAL_TABLET | Freq: Two times a day (BID) | ORAL | Status: DC

## 2014-01-28 NOTE — Progress Notes (Signed)
Subjective:    Patient ID: [name], female    DOB: 21-Oct-1960, 53 y.o.   MRN: [id number] Chief Complaint  Patient presents with  . Leg Pain    Right leg 1-2 weeks     HPI  Can't swallow K pill at all - to big. Has noticed legs swelling over the past 2 wks. No other abnormalities. Is here today with sign language interpreter. Bought compression hose as I recommended - she was fitted for them at the medical supply store but now she can't get them on so thinks she was given the wrong size. Has been toleraing lasix am - urinating more but no prob w/ incontinence and has not noticed any improvement in pedal edema. No CP/SHoB/orthopnea/apnea/paroxysmal nocturnal dyspenea. No palps or presyncope.  Past Medical History  Diagnosis Date  . Deaf    Current Outpatient Prescriptions on File Prior to Visit  Medication Sig Dispense Refill  . doxycycline (VIBRAMYCIN) 100 MG capsule Take 1 capsule (100 mg total) by mouth 2 (two) times daily.  20 capsule  0  . furosemide (LASIX) 40 MG tablet Take 1 tablet (40 mg total) by mouth daily.  30 tablet  0  . potassium chloride SA (K-DUR,KLOR-CON) 20 MEQ tablet Take 1 tablet (20 mEq total) by mouth daily.  30 tablet  0   No current facility-administered medications on file prior to visit.   Allergies  Allergen Reactions  . Penicillins Rash     Review of Systems  Constitutional: Positive for fatigue. Negative for fever, chills, diaphoresis and appetite change.  Eyes: Negative for visual disturbance.  Respiratory: Negative for apnea, cough, chest tightness, shortness of breath and wheezing.        No orthopnea or paroxysmal nocturnal dyspnea.  Cardiovascular: Positive for leg swelling. Negative for chest pain and palpitations.  Genitourinary: Positive for frequency. Negative for dysuria, urgency, hematuria, decreased urine volume, enuresis and difficulty urinating.  Musculoskeletal: Positive for arthralgias, back pain, joint swelling and  myalgias.  Skin: Negative for rash.  Neurological: Negative for syncope and headaches.  Hematological: Does not bruise/bleed easily.  Psychiatric/Behavioral: Negative for sleep disturbance.       Objective:  BP 120/80  Pulse 91  Temp(Src) 97.6 F (36.4 C) (Oral)  Resp 16  Ht  (1.676 m)  Wt 342 lb (155.13 kg)  BMI 55.23 kg/m2  SpO2 99%  Physical Exam  Constitutional: She is oriented to person, place, and time. She appears well-developed and well-nourished. No distress.  HENT:  Head: Normocephalic and atraumatic.  Right Ear: External ear normal.  Left Ear: External ear normal.  Eyes: Conjunctivae are normal. No scleral icterus.  Neck: Normal range of motion. Neck supple. No thyromegaly present.  Cardiovascular: Normal rate, regular rhythm, normal heart sounds and intact distal pulses.   Pedal pulses not palpable. 1+ pitting edema bilaterally  Pulmonary/Chest: Effort normal and breath sounds normal. No respiratory distress.  Musculoskeletal: She exhibits edema and tenderness.  Lymphadenopathy:    She has no cervical adenopathy.  Neurological: She is alert and oriented to person, place, and time.  Skin: Skin is warm and dry. She is not diaphoretic. No erythema.  Hyperpigmentation on right anterior leg - mildly warm and ttp  Psychiatric: She has a normal mood and affect. Her behavior is normal.      ll inches from base of foot laterally right leg was 17.5 in and 17.75 on left.    Results for orders placed in visit on 01/28/14  POCT CBC      Result Value Ref Range   WBC 7.3  4.6 - 10.2 K/uL   Lymph, poc 1.6  0.6 - 3.4   POC LYMPH PERCENT 22.2  10 - 50 %L   MID (cbc) 0.5  0 - 0.9   POC MID % 7.0  0 - 12 %M   POC Granulocyte 5.2  2 - 6.9   Granulocyte percent 70.8  37 - 80 %G   RBC 5.34  4.04 - 5.48 M/uL   Hemoglobin 14.3  12.2 - 16.2 g/dL   HCT, POC 62.1  30.8 - 47.9 %   MCV 83.9  80 - 97 fL   MCH, POC 26.8 (*) 27 - 31.2 pg   MCHC 31.9  31.8 - 35.4 g/dL   RDW,  POC 65.7     Platelet Count, POC 247  142 - 424 K/uL   MPV 7.9  0 - 99.8 fL  GLUCOSE, POCT (MANUAL RESULT ENTRY)      Result Value Ref Range   POC Glucose 142 (*) 70 - 99 mg/dl  POCT GLYCOSYLATED HEMOGLOBIN (HGB A1C)      Result Value Ref Range   Hemoglobin A1C 6.2     Assessment & Plan:  Language level caveat - pt is deaf, only speaks sign language, does not read lips. SL interpretor here today to translate for entire visit. Edema - Plan: POCT CBC, POCT SEDIMENTATION RATE - legs measuring smaller and weight 4 lbs down since starting lasix 40 qam 2d prev, L Ext doppler negative 2d ago. Still with significant edema - pt brought ted hose with her x-large w/ 15-20 mmHg compression but thinks they are to small though she was measured/fitted for them when she bought them from the medical supply store 2d ag - has not been able to put them on - I placed on 1 leg in office- showed pt that they they feel so tight intentionally - fit good and recommend considering small signs or increased compression strength but will re-eval in f/u in 1 wk.  Increase lasix and K to bid - try to get K crystals or can crush current K tablets and dissolve in applesauce since cannot swollen whole K tablet.  Will need repeat bmp at f/u.  Finish course of doxycycline though now suspect cellulitis is much less likely to be cause - suspect edema iis due to morbid obesity, sedentary lifestyle, and venous stasis  Elevated glucose - Plan: POCT glucose (manual entry), POCT glycosylated hemoglobin (Hb A1C) - new diagnosis today of pre-diabetes - reviewed low carb/sugar diet - recheck in 3 mos.  Severe obesity (BMI >= 40)  Encounter for long-term (current) use of other medications - Plan: Comprehensive metabolic panel, TSH  Prediabetes  Meds ordered this encounter  Medications  . furosemide (LASIX) 40 MG tablet    Sig: Take 1 tablet (40 mg total) by mouth 2 (two) times daily.    Dispense:  60 tablet    Refill:  0  . Potassium  Chloride CRYS    Sig: Take 20 mEq by mouth 2 (two) times daily. With lasix    Dispense:  1200 g    Refill:  0    Norberto Sorenson, MD MPH

## 2014-01-28 NOTE — Patient Instructions (Signed)
Insulin Resistance Blood sugar (glucose) levels are controlled by a hormone called insulin. Insulin is made by your pancreas. When your blood glucose goes up, insulin is released into your blood. Insulin is required for your body to function normally. However, your body can become resistant to your own insulin or to insulin given to treat diabetes. In either case, insulin resistance can lead to serious problems. These problems include:  Type 2 diabetes.  Heart disease.  High blood pressure.  Stroke.  Polycystic ovary syndrome.  Fatty liver. CAUSES  Insulin resistance can develop for many different reasons. It is more likely to happen in people with these conditions or characteristics:  Obesity.  Inactivity.  Pregnancy.  High blood pressure.  Stress.  Steroid use.  Infection or severe illness.  Increased levels of cholesterol and triglycerides. SYMPTOMS  There are no symptoms. You may have symptoms related to the various complications of insulin resistance.  DIAGNOSIS  Several different things can make your caregiver suspect you have insulin resistance. These include:  High blood glucose (hyperglycemia).  Abnormal cholesterol levels.  High uric acid levels.  Changes related to blood pressure.  Changes related to inflammation. Insulin resistance can be determined with blood tests. An elevated insulin level when you have not eaten might suggest resistance. Other more complicated tests are sometimes necessary. TREATMENT  Lifestyle changes are the most important treatment for insulin resistance.   If you are overweight and you have insulin resistance, you can improve your insulin sensitivity by losing weight.  Moderate exercise for 30-40 minutes, 4 days a week, can improve insulin sensitivity. Some medicines can also help improve your insulin sensitivity. Your caregiver can discuss these with you if they are appropriate.  HOME CARE INSTRUCTIONS   Do not  smoke.  Keep your weight at a healthy level.  Get exercise.  If you have diabetes, follow your caregiver's directions.  If you have high blood pressure, follow your caregiver's directions.  Only take prescription medicines for pain, fever, or discomfort as directed by your caregiver. SEEK MEDICAL CARE IF:   You are diabetic and you are having problems keeping your blood glucose levels at target range.  You are having episodes of low blood glucose (hypoglycemia).  You feel you might be having side effects from your medicines.  You have symptoms of an illness that is not improving after 3-4 days.  You have a sore or wound that is not healing.  You notice a change in vision or a new problem with your vision. SEEK IMMEDIATE MEDICAL CARE IF:   Your blood glucose goes below 70, especially if you have confusion, lightheadedness, or other symptoms with it.  Your blood glucose is very high (as advised by your caregiver) twice in a row.  You pass out.  You have chest pain or trouble breathing.  You have a sudden, severe headache.  You have sudden weakness in one arm or one leg.  You have sudden difficulty speaking or swallowing.  You develop vomiting or diarrhea that is getting worse or not improving after 1 day. Document Released: 06/19/2005 Document Revised: 10/30/2011 Document Reviewed: 10/09/2012 [facility] Patient Information 2015 [place], [place]. This information is not intended to replace advice given to you by your health care provider. Make sure you discuss any questions you have with your health care provider.   Diabetes Mellitus and Food It is important for you to manage your blood sugar (glucose) level. Your blood glucose level can be greatly affected by what you eat.  Eating healthier foods in the appropriate amounts throughout the day at about the same time each day will help you control your blood glucose level. It can also help slow or prevent worsening of your  diabetes mellitus. Healthy eating may even help you improve the level of your blood pressure and reach or maintain a healthy weight.  HOW CAN FOOD AFFECT ME? Carbohydrates Carbohydrates affect your blood glucose level more than any other type of food. Your dietitian will help you determine how many carbohydrates to eat at each meal and teach you how to count carbohydrates. Counting carbohydrates is important to keep your blood glucose at a healthy level, especially if you are using insulin or taking certain medicines for diabetes mellitus. Alcohol Alcohol can cause sudden decreases in blood glucose (hypoglycemia), especially if you use insulin or take certain medicines for diabetes mellitus. Hypoglycemia can be a life-threatening condition. Symptoms of hypoglycemia (sleepiness, dizziness, and disorientation) are similar to symptoms of having too much alcohol.  If your health care provider has given you approval to drink alcohol, do so in moderation and use the following guidelines:  Women should not have more than one drink per day, and men should not have more than two drinks per day. One drink is equal to:  12 oz of beer.  5 oz of wine.  1 oz of hard liquor.  Do not drink on an empty stomach.  Keep yourself hydrated. Have water, diet soda, or unsweetened iced tea.  Regular soda, juice, and other mixers might contain a lot of carbohydrates and should be counted. WHAT FOODS ARE NOT RECOMMENDED? As you make food choices, it is important to remember that all foods are not the same. Some foods have fewer nutrients per serving than other foods, even though they might have the same number of calories or carbohydrates. It is difficult to get your body what it needs when you eat foods with fewer nutrients. Examples of foods that you should avoid that are high in calories and carbohydrates but low in nutrients include:  Trans fats (most processed foods list trans fats on the Nutrition Facts  label).  Regular soda.  Juice.  Candy.  Sweets, such as cake, pie, doughnuts, and cookies.  Fried foods. WHAT FOODS CAN I EAT? Have nutrient-rich foods, which will nourish your body and keep you healthy. The food you should eat also will depend on several factors, including:  The calories you need.  The medicines you take.  Your weight.  Your blood glucose level.  Your blood pressure level.  Your cholesterol level. You also should eat a variety of foods, including:  Protein, such as meat, poultry, fish, tofu, nuts, and seeds (lean animal proteins are best).  Fruits.  Vegetables.  Dairy products, such as milk, cheese, and yogurt (low fat is best).  Breads, grains, pasta, cereal, rice, and beans.  Fats such as olive oil, trans fat-free margarine, canola oil, avocado, and olives. DOES EVERYONE WITH DIABETES MELLITUS HAVE THE SAME MEAL PLAN? Because every person with diabetes mellitus is different, there is not one meal plan that works for everyone. It is very important that you meet with a dietitian who will help you create a meal plan that is just right for you. Document Released: 01/25/2005 Document Revised: 05/05/2013 Document Reviewed: 03/27/2013 [facility] Patient Information 2015 [place], [place]. This information is not intended to replace advice given to you by your health care provider. Make sure you discuss any questions you have with your health care  provider.   Diabetes and Exercise Exercising regularly is important. It is not just about losing weight. It has many health benefits, such as:  Improving your overall fitness, flexibility, and endurance.  Increasing your bone density.  Helping with weight control.  Decreasing your body fat.  Increasing your muscle strength.  Reducing stress and tension.  Improving your overall health. People with diabetes who exercise gain additional benefits because exercise:  Reduces appetite.  Improves the body's  use of blood sugar (glucose).  Helps lower or control blood glucose.  Decreases blood pressure.  Helps control blood lipids (such as cholesterol and triglycerides).  Improves the body's use of the hormone insulin by:  Increasing the body's insulin sensitivity.  Reducing the body's insulin needs.  Decreases the risk for heart disease because exercising:  Lowers cholesterol and triglycerides levels.  Increases the levels of good cholesterol (such as high-density lipoproteins [HDL]) in the body.  Lowers blood glucose levels. YOUR ACTIVITY PLAN  Choose an activity that you enjoy and set realistic goals. Your health care provider or diabetes educator can help you make an activity plan that works for you. Exercise regularly as directed by your health care provider. This includes:  Performing resistance training twice a week such as push-ups, sit-ups, lifting weights, or using resistance bands.  Performing 150 minutes of cardio exercises each week such as walking, running, or playing sports.  Staying active and spending no more than 90 minutes at one time being inactive. Even short bursts of exercise are good for you. Three 10-minute sessions spread throughout the day are just as beneficial as a single 30-minute session. Some exercise ideas include:  Taking the dog for a walk.  Taking the stairs instead of the elevator.  Dancing to your favorite song.  Doing an exercise video.  Doing your favorite exercise with a friend. RECOMMENDATIONS FOR EXERCISING WITH TYPE 1 OR TYPE 2 DIABETES   Check your blood glucose before exercising. If blood glucose levels are greater than 240 mg/dL, check for urine ketones. Do not exercise if ketones are present.  Avoid injecting insulin into areas of the body that are going to be exercised. For example, avoid injecting insulin into:  The arms when playing tennis.  The legs when jogging.  Keep a record of:  Food intake before and after you  exercise.  Expected peak times of insulin action.  Blood glucose levels before and after you exercise.  The type and amount of exercise you have done.  Review your records with your health care provider. Your health care provider will help you to develop guidelines for adjusting food intake and insulin amounts before and after exercising.  If you take insulin or oral hypoglycemic agents, watch for signs and symptoms of hypoglycemia. They include:  Dizziness.  Shaking.  Sweating.  Chills.  Confusion.  Drink plenty of water while you exercise to prevent dehydration or heat stroke. Body water is lost during exercise and must be replaced.  Talk to your health care provider before starting an exercise program to make sure it is safe for you. Remember, almost any type of activity is better than none. Document Released: 07/21/2003 Document Revised: 09/14/2013 Document Reviewed: 10/07/2012 [facility] Patient Information 2015 [place], [place]. This information is not intended to replace advice given to you by your health care provider. Make sure you discuss any questions you have with your health care provider. Screening for Type 2 Diabetes Screening is a way to check for type 2 diabetes in people  who do not have symptoms of the disease, but who may likely develop diabetes in the future. Diabetes can lead to serious health problems, but finding diabetes early allows for early treatment. DIABETES RISK FACTORS   Family history of diabetes.  Diseases of the pancreas.  Obesity or being overweight.  Certain racial or ethnic groups:  American [place].  Pacific Islander.  Hispanic.  Asian.  African American.  High blood pressure (hypertension).  History of diabetes while pregnant (gestational diabetes).  Delivering a baby that weighed over 9 pounds.  Being inactive.  High cholesterol or triglycerides.  Age, especially over 72 years of age. WHO IS SCREENED Adults  Adults who  have no risk factors and no symptoms should be screened starting at age 42. If the screening tests are normal, they should be repeated every 3 years.  Adults who do not have symptoms, but are overweight, should be screened before age 15.  Adults who do not have symptoms, but have 1 or more risk factors, should be screened.  Adults who have an A1c (3 month average of blood glucose) greater than 5.7% or who had an impaired glucose tolerance (IGT) or impaired fasting glucose (IFG) on a previous test should be screened.  Pregnant women with or without risk factors should be screened.  Women who gave birth and had gestational diabetes should be screened. This testing should be done 6 to 12 weeks after the child is born. Children or Adolescents  Children and adolescents should be screened for type 2 diabetes if they are overweight and have 2 of the following risk factors:  Having a family history of type 2 diabetes.  Being a member of a high risk race or ethnic group.  Having signs of insulin resistance or conditions associated with insulin resistance.  Having a mother who had gestational diabetes while pregnant with him or her.  Screening should start at age 41 or at the onset of puberty, whichever comes first. This should be repeated every 2 years. SCREENING In a screening, your caregiver may:  Ask questions about your overall health. This will include questions about the health of close family members, too.  Ask about any diabetes-like symptoms you may have.  Perform a physical exam.  Order some tests that may include:  A fasting plasma glucose test. This measures the level of glucose in your blood. It is done after you have had nothing to eat but water (fasted) for 8 hours.  A random blood glucose test. This test is done without the need to fast.  An oral glucose tolerance test. This is a blood test done in 2 parts. First, a blood sample is taken after you have fasted. Then,  another sample is taken after you drink a liquid that contains a lot of sugar.  An A1c test. This test shows how much glucose has been in your blood over the past 2 to 3 months. Document Released: 02/24/2009 Document Revised: 07/23/2011 Document Reviewed: 12/06/2010 [facility] Patient Information 2015 [place], [place]. This information is not intended to replace advice given to you by your health care provider. Make sure you discuss any questions you have with your health care provider.

## 2014-01-29 LAB — TSH: TSH: 2.567 u[IU]/mL (ref 0.350–4.500)

## 2014-02-01 ENCOUNTER — Encounter: Payer: Self-pay | Admitting: Family Medicine

## 2014-02-03 ENCOUNTER — Telehealth: Payer: Self-pay | Admitting: *Deleted

## 2014-02-03 NOTE — Telephone Encounter (Signed)
Called Communication [phone] to schedule an interpreter for her OV with Dr. Clelia Croft on 02/26/14 at 1:45pm They will contact the Social Service with [facility] and make sure this is scheduled. Please follow up 3-4 days prior to OV to make sure they have someone scheduled to come out.

## 2014-02-03 NOTE — Progress Notes (Signed)
Left a message for patient to return call to schedule a 4 week follow up appointment with Dr. Clelia Croft for edema

## 2014-02-26 ENCOUNTER — Ambulatory Visit: Payer: Federal, State, Local not specified - PPO | Admitting: Family Medicine

## 2014-03-01 NOTE — Telephone Encounter (Signed)
Interpretor showed up at appt but pt did not.

## 2014-03-02 ENCOUNTER — Ambulatory Visit (INDEPENDENT_AMBULATORY_CARE_PROVIDER_SITE_OTHER): Payer: Federal, State, Local not specified - PPO | Admitting: Family Medicine

## 2014-03-02 VITALS — BP 142/80 | HR 94 | Temp 97.8°F | Resp 16 | Ht 66.0 in | Wt 332.2 lb

## 2014-03-02 DIAGNOSIS — H9193 Unspecified hearing loss, bilateral: Secondary | ICD-10-CM

## 2014-03-02 DIAGNOSIS — Z79899 Other long term (current) drug therapy: Secondary | ICD-10-CM

## 2014-03-02 DIAGNOSIS — A09 Infectious gastroenteritis and colitis, unspecified: Secondary | ICD-10-CM

## 2014-03-02 DIAGNOSIS — R609 Edema, unspecified: Secondary | ICD-10-CM

## 2014-03-02 DIAGNOSIS — H9190 Unspecified hearing loss, unspecified ear: Secondary | ICD-10-CM | POA: Insufficient documentation

## 2014-03-02 DIAGNOSIS — K529 Noninfective gastroenteritis and colitis, unspecified: Secondary | ICD-10-CM

## 2014-03-02 DIAGNOSIS — R1033 Periumbilical pain: Secondary | ICD-10-CM

## 2014-03-02 LAB — POCT CBC
GRANULOCYTE PERCENT: 72.6 % (ref 37–80)
HCT, POC: 45.1 % (ref 37.7–47.9)
Hemoglobin: 14.2 g/dL (ref 12.2–16.2)
Lymph, poc: 2.1 (ref 0.6–3.4)
MCH, POC: 26.8 pg — AB (ref 27–31.2)
MCHC: 31.5 g/dL — AB (ref 31.8–35.4)
MCV: 85 fL (ref 80–97)
MID (CBC): 0.3 (ref 0–0.9)
MPV: 7.8 fL (ref 0–99.8)
PLATELET COUNT, POC: 300 10*3/uL (ref 142–424)
POC Granulocyte: 6.3 (ref 2–6.9)
POC LYMPH %: 23.6 % (ref 10–50)
POC MID %: 3.8 %M (ref 0–12)
RBC: 5.3 M/uL (ref 4.04–5.48)
RDW, POC: 15.7 %
WBC: 8.7 10*3/uL (ref 4.6–10.2)

## 2014-03-02 LAB — POCT URINALYSIS DIPSTICK
BILIRUBIN UA: NEGATIVE
GLUCOSE UA: NEGATIVE
KETONES UA: 15
Leukocytes, UA: NEGATIVE
NITRITE UA: NEGATIVE
PH UA: 5.5
PROTEIN UA: NEGATIVE
Spec Grav, UA: 1.015
Urobilinogen, UA: 0.2

## 2014-03-02 LAB — POCT UA - MICROSCOPIC ONLY
CASTS, UR, LPF, POC: NEGATIVE
CRYSTALS, UR, HPF, POC: NEGATIVE
Mucus, UA: NEGATIVE
YEAST UA: NEGATIVE

## 2014-03-02 MED ORDER — ONDANSETRON 4 MG PO TBDP
4.0000 mg | ORAL_TABLET | Freq: Once | ORAL | Status: AC
  Administered 2014-03-02: 4 mg via ORAL

## 2014-03-02 MED ORDER — METRONIDAZOLE 500 MG PO TABS: 500.0000 mg | ORAL_TABLET | Freq: Three times a day (TID) | ORAL | Status: DC

## 2014-03-02 MED ORDER — PROMETHAZINE HCL 25 MG PO TABS: 25.0000 mg | ORAL_TABLET | Freq: Three times a day (TID) | ORAL | Status: DC | PRN

## 2014-03-02 MED ORDER — CIPROFLOXACIN HCL 500 MG PO TABS: 500.0000 mg | ORAL_TABLET | Freq: Two times a day (BID) | ORAL | Status: DC

## 2014-03-02 MED ORDER — TRAMADOL HCL 50 MG PO TABS: 50.0000 mg | ORAL_TABLET | Freq: Three times a day (TID) | ORAL | Status: DC | PRN

## 2014-03-02 NOTE — Patient Instructions (Addendum)
STOP your lasix and your potassium for now until all of your symptoms are completely better. Do NOT START THE ANTIBIOTICS METRONIDAZOLE AND CIPRO UNTIL AFTER YOU COLLECT THE STOOL SAMPLES. Have someone drop the stool samples off at our clinic asap but if you cannot bring them in until Friday that is better than nothing. Start the promethazine for nausea and vomiting so you can drink as much fluids as you can. You can eat if you feel like it - try some bananas, rice, yogurt. If your stomach pain gets worse, come back into clinic or go to the ER - you may need an xray or CT scan of your abdomen.  I know your knee is hurting you.  We will discuss that at your follow-up visit with Tishira on Friday - we will call you and leave a message for what time you should come in to the [facility] walk-in clinic to see Mal Misty - we will coordinate with a sign-language interpretor who can meet you here. You should ice your knee for 20 minutes for times a day.  Do not take any over the counter pain medicines for now - they could all make your stomach pains worse.  If you really need something for pain, you can take some tylenol or try some tramadol that you were prescribed.   Diarrhea Diarrhea is frequent loose and watery bowel movements. It can cause you to feel weak and dehydrated. Dehydration can cause you to become tired and thirsty, have a dry mouth, and have decreased urination that often is dark yellow. Diarrhea is a sign of another problem, most often an infection that will not last long. In most cases, diarrhea typically lasts 2-3 days. However, it can last longer if it is a sign of something more serious. It is important to treat your diarrhea as directed by your caregiver to lessen or prevent future episodes of diarrhea. CAUSES  Some common causes include:  Gastrointestinal infections caused by viruses, bacteria, or parasites.  Food poisoning or food allergies.  Certain medicines, such as antibiotics,  chemotherapy, and laxatives.  Artificial sweeteners and fructose.  Digestive disorders. HOME CARE INSTRUCTIONS  Ensure adequate fluid intake (hydration): Have 1 cup (8 oz) of fluid for each diarrhea episode. Avoid fluids that contain simple sugars or sports drinks, fruit juices, whole milk products, and sodas. Your urine should be clear or pale yellow if you are drinking enough fluids. Hydrate with an oral rehydration solution that you can purchase at pharmacies, retail stores, and online. You can prepare an oral rehydration solution at home by mixing the following ingredients together:   - tsp table salt.   tsp baking soda.   tsp salt substitute containing potassium chloride.  1  tablespoons sugar.  1 L (34 oz) of water.  Certain foods and beverages may increase the speed at which food moves through the gastrointestinal (GI) tract. These foods and beverages should be avoided and include:  Caffeinated and alcoholic beverages.  High-fiber foods, such as raw fruits and vegetables, nuts, seeds, and whole grain breads and cereals.  Foods and beverages sweetened with sugar alcohols, such as xylitol, sorbitol, and mannitol.  Some foods may be well tolerated and may help thicken stool including:  Starchy foods, such as rice, toast, pasta, low-sugar cereal, oatmeal, grits, baked potatoes, crackers, and bagels.  Bananas.  Applesauce.  Add probiotic-rich foods to help increase healthy bacteria in the GI tract, such as yogurt and fermented milk products.  Wash your hands well after  each diarrhea episode.  Only take over-the-counter or prescription medicines as directed by your caregiver.  Take a warm bath to relieve any burning or pain from frequent diarrhea episodes. SEEK IMMEDIATE MEDICAL CARE IF:   You are unable to keep fluids down.  You have persistent vomiting.  You have blood in your stool, or your stools are black and tarry.  You do not urinate in 6-8 hours, or there  is only a small amount of very dark urine.  You have abdominal pain that increases or localizes.  You have weakness, dizziness, confusion, or light-headedness.  You have a severe headache.  Your diarrhea gets worse or does not get better.  You have a fever or persistent symptoms for more than 2-3 days.  You have a fever and your symptoms suddenly get worse. MAKE SURE YOU:   Understand these instructions.  Will watch your condition.  Will get help right away if you are not doing well or get worse. Document Released: 04/20/2002 Document Revised: 09/14/2013 Document Reviewed: 01/06/2012 [facility] Patient Information 2015 [place], [place]. This information is not intended to replace advice given to you by your health care provider. Make sure you discuss any questions you have with your health care provider. Viral Gastroenteritis Viral gastroenteritis is also known as stomach flu. This condition affects the stomach and intestinal tract. It can cause sudden diarrhea and vomiting. The illness typically lasts 3 to 8 days. Most people develop an immune response that eventually gets rid of the virus. While this natural response develops, the virus can make you quite ill. CAUSES  Many different viruses can cause gastroenteritis, such as rotavirus or noroviruses. You can catch one of these viruses by consuming contaminated food or water. You may also catch a virus by sharing utensils or other personal items with an infected person or by touching a contaminated surface. SYMPTOMS  The most common symptoms are diarrhea and vomiting. These problems can cause a severe loss of body fluids (dehydration) and a body salt (electrolyte) imbalance. Other symptoms may include:  Fever.  Headache.  Fatigue.  Abdominal pain. DIAGNOSIS  Your caregiver can usually diagnose viral gastroenteritis based on your symptoms and a physical exam. A stool sample may also be taken to test for the presence of viruses or  other infections. TREATMENT  This illness typically goes away on its own. Treatments are aimed at rehydration. The most serious cases of viral gastroenteritis involve vomiting so severely that you are not able to keep fluids down. In these cases, fluids must be given through an intravenous line (IV). HOME CARE INSTRUCTIONS   Drink enough fluids to keep your urine clear or pale yellow. Drink small amounts of fluids frequently and increase the amounts as tolerated.  Ask your caregiver for specific rehydration instructions.  Avoid:  Foods high in sugar.  Alcohol.  Carbonated drinks.  Tobacco.  Juice.  Caffeine drinks.  Extremely hot or cold fluids.  Fatty, greasy foods.  Too much intake of anything at one time.  Dairy products until 24 to 48 hours after diarrhea stops.  You may consume probiotics. Probiotics are active cultures of beneficial bacteria. They may lessen the amount and number of diarrheal stools in adults. Probiotics can be found in yogurt with active cultures and in supplements.  Wash your hands well to avoid spreading the virus.  Only take over-the-counter or prescription medicines for pain, discomfort, or fever as directed by your caregiver. Do not give aspirin to children. Antidiarrheal medicines are not recommended.  Ask your caregiver if you should continue to take your regular prescribed and over-the-counter medicines.  Keep all follow-up appointments as directed by your caregiver. SEEK IMMEDIATE MEDICAL CARE IF:   You are unable to keep fluids down.  You do not urinate at least once every 6 to 8 hours.  You develop shortness of breath.  You notice blood in your stool or vomit. This may look like coffee grounds.  You have abdominal pain that increases or is concentrated in one small area (localized).  You have persistent vomiting or diarrhea.  You have a fever.  The patient is a child younger than 3 months, and he or she has a fever.  The  patient is a child older than 3 months, and he or she has a fever and persistent symptoms.  The patient is a child older than 3 months, and he or she has a fever and symptoms suddenly get worse.  The patient is a baby, and he or she has no tears when crying. MAKE SURE YOU:   Understand these instructions.  Will watch your condition.  Will get help right away if you are not doing well or get worse. Document Released: 04/30/2005 Document Revised: 07/23/2011 Document Reviewed: 02/14/2011 [facility] Patient Information 2015 [place], [place]. This information is not intended to replace advice given to you by your health care provider. Make sure you discuss any questions you have with your health care provider.

## 2014-03-02 NOTE — Progress Notes (Signed)
   Subjective:    Patient ID: [name], female    DOB: [birth year], 53 y.o.   MRN: [id number]  HPI Patient presents to clinic without interpretor with 5 days of abdominal pain and diarrhea. Has had 2-3 watery stools per day mostly associated with meals. Denies blood in stool and not sure if she has seen mucus or not. Has had decreased appetite, but was able to eat cereal this morning and it was tolerated. Able to drink water without trouble. Has felt bloated and is passing flatulence. Is nauseas without vomiting. Denies urinary symptoms, sick contacts, eating out recently, eating undercooked meats.   Has had hysterectomy, cholecystectomy, hernia repair, and apenedectomy. Had colonoscopy in 2008 that found diverticulum.  She works at the [employer] and missed worked yesterday and does not feel up to working today. Is concerned that she may be contagious.   Review of Systems  Constitutional: Positive for appetite change (decreased). Negative for fever.  Respiratory: Negative for shortness of breath.   Cardiovascular: Negative for chest pain.  Gastrointestinal: Positive for nausea, abdominal pain, diarrhea and abdominal distention. Negative for vomiting, constipation and blood in stool.  Genitourinary: Negative.   Neurological: Negative for dizziness and headaches.       Objective:   Physical Exam       Assessment & Plan:  See Dr. Alver FisherShaw's note for Exam and A &S   April Ridgeishira Greer [place], PA-C

## 2014-03-02 NOTE — Progress Notes (Signed)
Subjective:    Patient ID: [name], female    DOB: 07/03/1960, 53 y.o.   MRN: [id number] Chief Complaint  Patient presents with  . Abdominal Pain    pt has abdominal pain with no fever/nausea or vomiting  . Leg Pain    x4-5 days days; right leg  . Knee Pain    x4-5 days; right knee  . Chills    HPI  Pt missed her OV that was sched 4d prev - an interpretor showed up to translate but pt did not come as she did not have transportation (and could not call as she is deaf.) She c/o central umbilical abd pain with nausea and diarrhea - denies vomiting or constipation.  Started 5d ago but upon further questions states she has not been to work for 1 wk so sxs must have started about 7-8d ago. Having about 2-3 BM daily, able to tolerate po - eating small amounts and drinking water.  Eating and moving makes sxs worse, nothing makes things better. Tried some advil for sxs but no other attempted treatments. No recent antibiotic use.  Also having a lot of Rt knee pain.  Is still taking lasix 40mg  qd and K20 which we started about a mo prev to help with lower ext edema   Past Medical History  Diagnosis Date  . Deaf    Current Outpatient Prescriptions on File Prior to Visit  Medication Sig Dispense Refill  . furosemide (LASIX) 40 MG tablet Take 1 tablet (40 mg total) by mouth 2 (two) times daily.  60 tablet  0  . Potassium Chloride CRYS Take 20 mEq by mouth 2 (two) times daily. With lasix  1200 g  0   No current facility-administered medications on file prior to visit.   Allergies  Allergen Reactions  . Penicillins Rash     Review of Systems  Constitutional: Positive for chills, activity change and appetite change. Negative for fever and unexpected weight change.  Cardiovascular: Positive for leg swelling.  Gastrointestinal: Positive for nausea, abdominal pain and diarrhea. Negative for vomiting, constipation, blood in stool and anal bleeding.  Musculoskeletal: Positive for  arthralgias, gait problem and joint swelling.  Skin: Negative for color change and rash.       Objective:  BP 142/80  Pulse 94  Temp(Src) 97.8 F (36.6 C) (Oral)  Resp 16  Ht 5\' 6"  (1.676 m)  Wt 332 lb 3.2 oz (150.685 kg)  BMI 53.64 kg/m2  SpO2 97%  Physical Exam  Constitutional: She is oriented to person, place, and time. She appears well-developed and well-nourished. No distress.  Room smells strongly of stool  HENT:  Head: Normocephalic and atraumatic.  Neck: Normal range of motion. Neck supple. No thyromegaly present.  Cardiovascular: Normal rate, regular rhythm, normal heart sounds and intact distal pulses.   Pulmonary/Chest: Effort normal and breath sounds normal. No respiratory distress.  Abdominal: Soft. Normal appearance. Bowel sounds are increased. There is no hepatosplenomegaly. There is generalized tenderness. There is no rigidity, no rebound, no guarding, no CVA tenderness, no tenderness at McBurney's point and negative Murphy's sign. A hernia is present. Hernia confirmed positive in the ventral area.  Large vertical lower abd scar and reducible non-tender periumbilical hernia Exam limited by abd obesity and body habitus  Musculoskeletal: She exhibits no edema.  Lymphadenopathy:    She has no cervical adenopathy.  Neurological: She is alert and oriented to person, place, and time.  Skin: Skin is warm and dry. She  is not diaphoretic. No erythema.  Psychiatric: She has a normal mood and affect. Her behavior is normal.          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  She had an appt sched for 4d prev with interpretor sched - interpretor waited for 2 hrs, pt dnka - states she did not have transportation and couldn't call as deaf. Pt had had sxs of 3d at that time.  Severe obesity (BMI >= 40) - Plan: Clostridium Difficile by PCR, Stool  culture, Fecal lactoferrin  Edema - Plan: Clostridium Difficile by PCR, Stool culture, Fecal lactoferrin - stop lasix and K while having diarrhea - weight has decreased 10 lbs in sev wks - could be due to lasix as well as GI illness.  Pt c/o Rt knee pain as well - had Rt LExt doppler 1 mo ago which was neg. Rec RICE and re-eval knee pain at f/u w/ nx/ and exam at that time - cons xray to eval for OA.  Encounter for long-term (current) use of medications - Plan: Clostridium Difficile by PCR, Stool culture, Fecal lactoferrin  Periumbilical abdominal pain - Plan: Comprehensive metabolic panel, POCT CBC, POCT UA - Microscopic Only, POCT urinalysis dipstick, IFOBT POC (occult bld, rslt in office), Lipase, Clostridium Difficile by PCR, Stool culture, Fecal lactoferrin, CANCELED: Clostridium difficile EIA  Gastroenteritis, infectious, presumed - Plan: ondansetron (ZOFRAN-ODT) disintegrating tablet 4 mg - Pt seems to mainly be hear for work excuse - wants oow note for the whole past wk (9/13-9/20) - she missed her sched appt w/ me w/ an interpretor on 9/16. She states she has to go back to work tomorrow - cannot afford to take more time off. I am concerned that she may still be infectious so pt should not RTW until after her recheck OV in 2d w/ Tishira - have asked TL to sched interpretor for this visit.  Start cipro and flagyl - collect stool studies and bring back to clinic asap.  If sxs worsen at all, would cons abd/pelvic CT. Advised no anti-diarrheal agents until recheck and stool studies.  Push fluilds.   Meds ordered this encounter  Medications  . promethazine (PHENERGAN) 25 MG tablet    Sig: Take 1 tablet (25 mg total) by mouth every 8 (eight) hours as needed for nausea or vomiting.    Dispense:  40 tablet    Refill:  0  . traMADol (ULTRAM) 50 MG tablet    Sig: Take 1 tablet (50 mg total) by mouth every 8 (eight) hours as needed (for right knee pain).    Dispense:  30 tablet    Refill:  0  .  ciprofloxacin (CIPRO) 500 MG tablet    Sig: Take 1 tablet (500 mg total) by mouth 2 (two) times daily.    Dispense:  14 tablet    Refill:  0  . metroNIDAZOLE (FLAGYL) 500 MG tablet    Sig: Take 1 tablet (500 mg total) by mouth 3 (three) times daily.    Dispense:  21 tablet    Refill:  0  . ondansetron (ZOFRAN-ODT) disintegrating tablet 4 mg    Sig:      Norberto SorensonEva Karyss Frese, MD MPH

## 2014-03-03 LAB — COMPREHENSIVE METABOLIC PANEL
ALK PHOS: 74 U/L (ref 39–117)
ALT: 9 U/L (ref 0–35)
AST: 8 U/L (ref 0–37)
Albumin: 4 g/dL (ref 3.5–5.2)
BUN: 15 mg/dL (ref 6–23)
CO2: 27 mEq/L (ref 19–32)
CREATININE: 1.03 mg/dL (ref 0.50–1.10)
Calcium: 9.8 mg/dL (ref 8.4–10.5)
Chloride: 102 mEq/L (ref 96–112)
GLUCOSE: 108 mg/dL — AB (ref 70–99)
POTASSIUM: 4 meq/L (ref 3.5–5.3)
Sodium: 138 mEq/L (ref 135–145)
Total Bilirubin: 0.6 mg/dL (ref 0.2–1.2)
Total Protein: 7.2 g/dL (ref 6.0–8.3)

## 2014-03-03 LAB — IFOBT (OCCULT BLOOD): IFOBT: NEGATIVE

## 2014-03-03 LAB — LIPASE: Lipase: 10 U/L (ref 0–75)

## 2014-03-03 NOTE — Progress Notes (Signed)
I am working on this

## 2014-03-04 LAB — CLOSTRIDIUM DIFFICILE BY PCR: Toxigenic C. Difficile by PCR: NOT DETECTED

## 2014-03-04 LAB — FECAL LACTOFERRIN, QUANT: Lactoferrin: NEGATIVE

## 2014-03-05 ENCOUNTER — Ambulatory Visit (INDEPENDENT_AMBULATORY_CARE_PROVIDER_SITE_OTHER): Payer: Federal, State, Local not specified - PPO | Admitting: Physician Assistant

## 2014-03-05 VITALS — BP 138/88 | HR 102 | Temp 98.0°F | Resp 17 | Ht 65.5 in | Wt 328.0 lb

## 2014-03-05 DIAGNOSIS — R197 Diarrhea, unspecified: Secondary | ICD-10-CM

## 2014-03-05 DIAGNOSIS — H9193 Unspecified hearing loss, bilateral: Secondary | ICD-10-CM

## 2014-03-05 NOTE — Progress Notes (Signed)
Subjective:    Patient ID: [name], female   [name] DOB: [birth year], 53 y.o.   MRN: [id number]  HPI Patient presents with sign language interpretor for follow up for abdominal pain and diarrhea. States that bowel movements are improved and she is having 1 stool/day. This morning's stool was "normal" and not watery. Denies blood or mucus in stool, fevers, abdominal pain, and N/V. Had chicken for dinner and apple juice which were both tolerated. Was only taking cipro once daily and metronidazole twice a day. Metronidazole was not well tolerated.    Review of Systems  Constitutional: Negative for fever, chills, fatigue and unexpected weight change.  Respiratory: Negative for shortness of breath.   Cardiovascular: Negative for chest pain.  Gastrointestinal: Negative for nausea, vomiting, abdominal pain, diarrhea, constipation and blood in stool.  Neurological: Negative for dizziness, light-headedness and headaches.       Objective:   Physical Exam  Constitutional: She is oriented to person, place, and time. She appears well-developed and well-nourished. No distress.  Blood pressure 138/88, pulse 102, temperature 98 F (36.7 C), temperature source Oral, resp. rate 17, height 5' 5.5" (1.664 m), weight 328 lb (148.78 kg), SpO2 98.00%.   HENT:  Head: Normocephalic and atraumatic.  Right Ear: External ear normal.  Left Ear: External ear normal.  Cardiovascular: Normal rate, regular rhythm, normal heart sounds and intact distal pulses.  Exam reveals no gallop and no friction rub.   No murmur heard. Pulmonary/Chest: Effort normal and breath sounds normal. She has no wheezes. She has no rales.  Abdominal: Soft. Bowel sounds are normal. There is no tenderness.  Neurological: She is alert and oriented to person, place, and time.  Skin: Skin is warm and dry. No rash noted. She is not diaphoretic. No erythema. No pallor.   Results for orders placed in visit on 03/02/14  CLOSTRIDIUM DIFFICILE  BY PCR      Result Value Ref Range   C difficile by pcr Not Detected  Not Detected  STOOL CULTURE      Result Value Ref Range   Preliminary Report No Suspicious Colonies, Continuing to Hold    COMPREHENSIVE METABOLIC PANEL      Result Value Ref Range   Sodium 138  135 - 145 mEq/L   Potassium 4.0  3.5 - 5.3 mEq/L   Chloride 102  96 - 112 mEq/L   CO2 27  19 - 32 mEq/L   Glucose, Bld 108 (*) 70 - 99 mg/dL   BUN 15  6 - 23 mg/dL   Creat 2.131.03  0.860.50 - 5.781.10 mg/dL   Total Bilirubin 0.6  0.2 - 1.2 mg/dL   Alkaline Phosphatase 74  39 - 117 U/L   AST 8  0 - 37 U/L   ALT 9  0 - 35 U/L   Total Protein 7.2  6.0 - 8.3 g/dL   Albumin 4.0  3.5 - 5.2 g/dL   Calcium 9.8  8.4 - 46.910.5 mg/dL  LIPASE      Result Value Ref Range   Lipase 10  0 - 75 U/L  FECAL LACTOFERRIN      Result Value Ref Range   Lactoferrin NEGATIVE    POCT CBC      Result Value Ref Range   WBC 8.7  4.6 - 10.2 K/uL   Lymph, poc 2.1  0.6 - 3.4   POC LYMPH PERCENT 23.6  10 - 50 %L   MID (cbc) 0.3  0 - 0.9  POC MID % 3.8  0 - 12 %M   POC Granulocyte 6.3  2 - 6.9   Granulocyte percent 72.6  37 - 80 %G   RBC 5.30  4.04 - 5.48 M/uL   Hemoglobin 14.2  12.2 - 16.2 g/dL   HCT, POC 19.145.1  47.837.7 - 47.9 %   MCV 85.0  80 - 97 fL   MCH, POC 26.8 (*) 27 - 31.2 pg   MCHC 31.5 (*) 31.8 - 35.4 g/dL   RDW, POC 29.515.7     Platelet Count, POC 300  142 - 424 K/uL   MPV 7.8  0 - 99.8 fL  POCT UA - MICROSCOPIC ONLY      Result Value Ref Range   WBC, Ur, HPF, POC 1-4     RBC, urine, microscopic 0-2     Bacteria, U Microscopic trace     Mucus, UA neg     Epithelial cells, urine per micros 0-1     Crystals, Ur, HPF, POC neg     Casts, Ur, LPF, POC neg     Yeast, UA neg    POCT URINALYSIS DIPSTICK      Result Value Ref Range   Color, UA yellow     Clarity, UA clear     Glucose, UA neg     Bilirubin, UA neg     Ketones, UA 15     Spec Grav, UA 1.015     Blood, UA trace-lysed     pH, UA 5.5     Protein, UA neg     Urobilinogen, UA 0.2      Nitrite, UA neg     Leukocytes, UA Negative    IFOBT (OCCULT BLOOD)      Result Value Ref Range   IFOBT Negative         Assessment & Plan:  1. Diarrhea Resolved. Patient not taking antibiotics properly. Discontinue.  2. Difficulty hearing, bilateral Interpretor used.   Janan Ridgeishira Adreana Coull PA-C  [facility] 03/05/2014 1:16 PM

## 2014-03-06 ENCOUNTER — Encounter: Payer: Self-pay | Admitting: Family Medicine

## 2014-03-07 LAB — STOOL CULTURE

## 2014-08-20 ENCOUNTER — Other Ambulatory Visit: Payer: Self-pay | Admitting: Obstetrics and Gynecology

## 2014-08-23 LAB — CYTOLOGY - PAP

## 2014-12-02 ENCOUNTER — Emergency Department (HOSPITAL_COMMUNITY)
Admission: EM | Admit: 2014-12-02 | Discharge: 2014-12-02 | Disposition: A | Discharge status: Home / Self Care | Payer: Federal, State, Local not specified - PPO | Attending: Emergency Medicine | Admitting: Emergency Medicine

## 2014-12-02 ENCOUNTER — Encounter (HOSPITAL_COMMUNITY): Payer: Self-pay | Admitting: *Deleted

## 2014-12-02 ENCOUNTER — Emergency Department (HOSPITAL_COMMUNITY): Payer: Federal, State, Local not specified - PPO

## 2014-12-02 DIAGNOSIS — G8929 Other chronic pain: Secondary | ICD-10-CM | POA: Insufficient documentation

## 2014-12-02 DIAGNOSIS — M25561 Pain in right knee: Secondary | ICD-10-CM | POA: Insufficient documentation

## 2014-12-02 DIAGNOSIS — F419 Anxiety disorder, unspecified: Secondary | ICD-10-CM | POA: Insufficient documentation

## 2014-12-02 DIAGNOSIS — H9190 Unspecified hearing loss, unspecified ear: Secondary | ICD-10-CM | POA: Insufficient documentation

## 2014-12-02 DIAGNOSIS — Z880 Allergy status to penicillin: Secondary | ICD-10-CM | POA: Diagnosis not present

## 2014-12-02 DIAGNOSIS — F439 Reaction to severe stress, unspecified: Secondary | ICD-10-CM | POA: Diagnosis present

## 2014-12-02 DIAGNOSIS — R110 Nausea: Secondary | ICD-10-CM | POA: Diagnosis not present

## 2014-12-02 DIAGNOSIS — Z792 Long term (current) use of antibiotics: Secondary | ICD-10-CM | POA: Insufficient documentation

## 2014-12-02 DIAGNOSIS — Z79899 Other long term (current) drug therapy: Secondary | ICD-10-CM | POA: Diagnosis not present

## 2014-12-02 HISTORY — DX: Pain in right knee: M25.561

## 2014-12-02 HISTORY — DX: Other chronic pain: G89.29

## 2014-12-02 LAB — CBC WITH DIFFERENTIAL/PLATELET
Basophils Absolute: 0 10*3/uL (ref 0.0–0.1)
Basophils Relative: 0 % (ref 0–1)
EOS ABS: 0.1 10*3/uL (ref 0.0–0.7)
EOS PCT: 1 % (ref 0–5)
HCT: 42.1 % (ref 36.0–46.0)
Hemoglobin: 13.1 g/dL (ref 12.0–15.0)
LYMPHS PCT: 25 % (ref 12–46)
Lymphs Abs: 2.2 10*3/uL (ref 0.7–4.0)
MCH: 27.1 pg (ref 26.0–34.0)
MCHC: 31.1 g/dL (ref 30.0–36.0)
MCV: 87.2 fL (ref 78.0–100.0)
Monocytes Absolute: 0.9 10*3/uL (ref 0.1–1.0)
Monocytes Relative: 11 % (ref 3–12)
NEUTROS ABS: 5.5 10*3/uL (ref 1.7–7.7)
NEUTROS PCT: 63 % (ref 43–77)
Platelets: 260 10*3/uL (ref 150–400)
RBC: 4.83 MIL/uL (ref 3.87–5.11)
RDW: 15.2 % (ref 11.5–15.5)
WBC: 8.6 10*3/uL (ref 4.0–10.5)

## 2014-12-02 LAB — COMPREHENSIVE METABOLIC PANEL
ALBUMIN: 3.9 g/dL (ref 3.5–5.0)
ALT: 12 U/L — AB (ref 14–54)
AST: 11 U/L — ABNORMAL LOW (ref 15–41)
Alkaline Phosphatase: 72 U/L (ref 38–126)
Anion gap: 7 (ref 5–15)
BILIRUBIN TOTAL: 0.2 mg/dL — AB (ref 0.3–1.2)
BUN: 16 mg/dL (ref 6–20)
CALCIUM: 9.1 mg/dL (ref 8.9–10.3)
CHLORIDE: 110 mmol/L (ref 101–111)
CO2: 24 mmol/L (ref 22–32)
Creatinine, Ser: 1.24 mg/dL — ABNORMAL HIGH (ref 0.44–1.00)
GFR, EST AFRICAN AMERICAN: 56 mL/min — AB (ref >60)
GFR, EST NON AFRICAN AMERICAN: 49 mL/min — AB (ref >60)
GLUCOSE: 111 mg/dL — AB (ref 65–99)
POTASSIUM: 3.9 mmol/L (ref 3.5–5.1)
Sodium: 141 mmol/L (ref 135–145)
TOTAL PROTEIN: 7.2 g/dL (ref 6.5–8.1)

## 2014-12-02 LAB — URINALYSIS, ROUTINE W REFLEX MICROSCOPIC
BILIRUBIN URINE: NEGATIVE
Glucose, UA: NEGATIVE mg/dL
Hgb urine dipstick: NEGATIVE
KETONES UR: NEGATIVE mg/dL
Leukocytes, UA: NEGATIVE
Nitrite: NEGATIVE
PH: 6 (ref 5.0–8.0)
Protein, ur: NEGATIVE mg/dL
Specific Gravity, Urine: 1.02 (ref 1.005–1.030)
UROBILINOGEN UA: 0.2 mg/dL (ref 0.0–1.0)

## 2014-12-02 LAB — I-STAT TROPONIN, ED: TROPONIN I, POC: 0 ng/mL (ref 0.00–0.08)

## 2014-12-02 LAB — LIPASE, BLOOD: Lipase: 16 U/L — ABNORMAL LOW (ref 22–51)

## 2014-12-02 MED ORDER — SODIUM CHLORIDE 0.9 % IV SOLN: Freq: Once | INTRAVENOUS | Status: DC

## 2014-12-02 MED ORDER — PROMETHAZINE HCL 25 MG PO TABS: 25.0000 mg | ORAL_TABLET | Freq: Four times a day (QID) | ORAL | Status: DC | PRN

## 2014-12-02 MED ORDER — SODIUM CHLORIDE 0.9 % IV BOLUS (SEPSIS)
1000.0000 mL | Freq: Once | INTRAVENOUS | Status: AC
  Administered 2014-12-02: 1000 mL via INTRAVENOUS

## 2014-12-02 MED ORDER — PROMETHAZINE HCL 25 MG RE SUPP: 25.0000 mg | Freq: Four times a day (QID) | RECTAL | Status: DC | PRN

## 2014-12-02 MED ORDER — HYDROXYZINE HCL 25 MG PO TABS: 25.0000 mg | ORAL_TABLET | Freq: Three times a day (TID) | ORAL | Status: DC | PRN

## 2014-12-02 MED ORDER — ONDANSETRON HCL 4 MG/2ML IJ SOLN
4.0000 mg | INTRAMUSCULAR | Status: AC
  Administered 2014-12-02: 4 mg via INTRAVENOUS
  Filled 2014-12-02: qty 2

## 2014-12-02 NOTE — ED Notes (Signed)
PA at bedside.

## 2014-12-02 NOTE — Discharge Instructions (Signed)
Panic Attacks °Panic attacks are sudden, short-lived surges of severe anxiety, fear, or discomfort. They may occur for no reason when you are relaxed, when you are anxious, or when you are sleeping. Panic attacks may occur for a number of reasons:  °· Healthy people occasionally have panic attacks in extreme, life-threatening situations, such as war or natural disasters. Normal anxiety is a protective mechanism of the body that helps us react to danger (fight or flight response). °· Panic attacks are often seen with anxiety disorders, such as panic disorder, social anxiety disorder, generalized anxiety disorder, and phobias. Anxiety disorders cause excessive or uncontrollable anxiety. They may interfere with your relationships or other life activities. °· Panic attacks are sometimes seen with other mental illnesses, such as depression and posttraumatic stress disorder. °· Certain medical conditions, prescription medicines, and drugs of abuse can cause panic attacks. °SYMPTOMS  °Panic attacks start suddenly, peak within 20 minutes, and are accompanied by four or more of the following symptoms: °· Pounding heart or fast heart rate (palpitations). °· Sweating. °· Trembling or shaking. °· Shortness of breath or feeling smothered. °· Feeling choked. °· Chest pain or discomfort. °· Nausea or strange feeling in your stomach. °· Dizziness, light-headedness, or feeling like you will faint. °· Chills or hot flushes. °· Numbness or tingling in your lips or hands and feet. °· Feeling that things are not real or feeling that you are not yourself. °· Fear of losing control or going crazy. °· Fear of dying. °Some of these symptoms can mimic serious medical conditions. For example, you may think you are having a heart attack. Although panic attacks can be very scary, they are not life threatening. °DIAGNOSIS  °Panic attacks are diagnosed through an assessment by your health care provider. Your health care provider will ask  questions about your symptoms, such as where and when they occurred. Your health care provider will also ask about your medical history and use of alcohol and drugs, including prescription medicines. Your health care provider may order blood tests or other studies to rule out a serious medical condition. Your health care provider may refer you to a mental health professional for further evaluation. °TREATMENT  °· Most healthy people who have one or two panic attacks in an extreme, life-threatening situation will not require treatment. °· The treatment for panic attacks associated with anxiety disorders or other mental illness typically involves counseling with a mental health professional, medicine, or a combination of both. Your health care provider will help determine what treatment is best for you. °· Panic attacks due to physical illness usually go away with treatment of the illness. If prescription medicine is causing panic attacks, talk with your health care provider about stopping the medicine, decreasing the dose, or substituting another medicine. °· Panic attacks due to alcohol or drug abuse go away with abstinence. Some adults need professional help in order to stop drinking or using drugs. °HOME CARE INSTRUCTIONS  °· Take all medicines as directed by your health care provider.   °· Schedule and attend follow-up visits as directed by your health care provider. It is important to keep all your appointments. °SEEK MEDICAL CARE IF: °· You are not able to take your medicines as prescribed. °· Your symptoms do not improve or get worse. °SEEK IMMEDIATE MEDICAL CARE IF:  °· You experience panic attack symptoms that are different than your usual symptoms. °· You have serious thoughts about hurting yourself or others. °· You are taking medicine for panic attacks and   have a serious side effect. MAKE SURE YOU:  Understand these instructions.  Will watch your condition.  Will get help right away if you are not  doing well or get worse. Document Released: 04/30/2005 Document Revised: 05/05/2013 Document Reviewed: 12/12/2012 [facility] Patient Information 2015 [place], [place]. This information is not intended to replace advice given to you by your health care provider. Make sure you discuss any questions you have with your health care provider.  Nausea, Adult Nausea means you feel sick to your stomach or need to throw up (vomit). It may be a sign of a more serious problem. If nausea gets worse, you may throw up. If you throw up a lot, you may lose too much body fluid (dehydration). HOME CARE   Get plenty of rest.  Ask your doctor how to replace body fluid losses (rehydrate).  Eat small amounts of food. Sip liquids more often.  Take all medicines as told by your doctor. GET HELP RIGHT AWAY IF:  You have a fever.  You pass out (faint).  You keep throwing up or have blood in your throw up.  You are very weak, have dry lips or a dry mouth, or you are very thirsty (dehydrated).  You have dark or bloody poop (stool).  You have very bad chest or belly (abdominal) pain.  You do not get better after 2 days, or you get worse.  You have a headache. MAKE SURE YOU:  Understand these instructions.  Will watch your condition.  Will get help right away if you are not doing well or get worse. Document Released: 04/19/2011 Document Revised: 07/23/2011 Document Reviewed: 04/19/2011 [facility] Patient Information 2015 [place], [place]. This information is not intended to replace advice given to you by your health care provider. Make sure you discuss any questions you have with your health care provider.

## 2014-12-02 NOTE — ED Notes (Signed)
Pt is deaf and interpreter wont be here until around 520is.  Writing on paper with pen to communicate with pt and pt's therapist.  Pt "feeling funny-stressed" since Tuesday.  Pt taking Zoloft. Pt state that she started taking it last month but new medicine 2 days ago.  Pt also c/o right knee pain.

## 2014-12-02 NOTE — ED Notes (Signed)
Pt. Is deaf but made aware for the need of urine.

## 2014-12-02 NOTE — ED Notes (Addendum)
Interpreter at bedside.  Provider made aware.

## 2014-12-02 NOTE — ED Notes (Signed)
This assessment was completed w/ assistance of sign language interpreter.  Pt c/o intermittent chest discomfort, stomach "uneasiness," and feeling overwhelmed since her mother's death, chronic R knee pain, and cataracts in L eye.  Pt reports frequent tension and stress w/ family.  Pt's therapist brought her to the ED, due to concern for elevated BP and r/o cardiac issues.  Pt sts that she is supposed to have surgery on R knee and L eye, but both have been postponed.

## 2014-12-02 NOTE — ED Notes (Signed)
Patient transported to X-ray 

## 2014-12-02 NOTE — ED Provider Notes (Signed)
CSN: [id number]     Arrival date & time 12/02/14  1621 History   First MD Initiated Contact with Patient 12/02/14 1655     Chief Complaint  Patient presents with  . Knee Pain  . stressed      (Consider location/radiation/quality/duration/timing/severity/associated sxs/prior Treatment) The history is provided by the patient. The history is limited by a language barrier (Pt is deaf, with multiple interpreters present). A language interpreter was used.   At bedside is pt's therapist, two language interpreters, a social worker  Patient has presented to the emergency department today with increased stress and a "funny" feeling since a recent psychiatry appointment approximately 8 days ago. She states she was given a new medicine for her depression, which she has had for the past 4 months since the death of her mother. She was put on Zoloft, the prescription she has brought into the ER today is sertraline 50 mg. Apparently 2 weeks ago due to increased stress, family altercations, the patient has not felt well, and discontinue taking her medicine at that time. She did resume it after her appointment.  The patient has had difficulty sleeping of getting to sleep and staying to sleep, she wakes up every night between 3 and 4 AM and is unable to get back to sleep. She has decreased appetite, with some mild nausea.  She has felt some tightness and heaviness in her chest area, described as a pressure.  She has had multiple arguments and increased tension with her family members, and 2 days ago her symptoms increased , she notes increased visits to the bathroom with loose stools, no blood, and she has not had any vomiting episodes.  She has had generalized muscle tension, but denies any sweats, chills, fever, abdominal pain.  Additional history is given by the patient's therapist and a Child psychotherapist who is present. They state in the last 4 months patient has been evaluated by psychiatry and diagnosed with major  depressive disorder, which has come same time as the death of her mother. They state she does not carry a diagnosis of anxiety. There is family members of the patient reportedly very unsupportive and problematic.  The therapist and social worker have come in with her today, with concerns of elevated blood pressure and concerns with support issues at home.  No one in the room and believes that the patient has been offered any anxiolytics, such as Vistaril, trazodone or benzos.  She has not particularly dealt with anxiety in the past. Patient and chart review has been noted to have multiple episodes of elevated blood pressure systolic 150s to 409W, as well as heart rate in the low 100s. She denies taking any blood pressure medication.  Patient is a poor historian, she is unable to answer specific questions related to her chest discomfort, but she does state that is ascending tension, pressure, and uneasiness, and appears to be related to high stress situations.    Past Medical History  Diagnosis Date  . Deaf   . Chronic pain of right knee    Past Surgical History  Procedure Laterality Date  . Wisdom tooth extraction    . Abdominal hysterectomy    . Cholecystectomy     No family history on file. History  Substance Use Topics  . Smoking status: Never Smoker   . Smokeless tobacco: Not on file  . Alcohol Use: Not on file   OB History    No data available     Review of  Systems    Allergies  Penicillins  Home Medications   Prior to Admission medications   Medication Sig Start Date End Date Taking? Authorizing Provider  ciprofloxacin (CIPRO) 500 MG tablet Take 1 tablet (500 mg total) by mouth 2 (two) times daily. 03/02/14   Sherren Mocha, MD  furosemide (LASIX) 40 MG tablet Take 1 tablet (40 mg total) by mouth 2 (two) times daily. 01/28/14   Sherren Mocha, MD  metroNIDAZOLE (FLAGYL) 500 MG tablet Take 1 tablet (500 mg total) by mouth 3 (three) times daily. 03/02/14   Sherren Mocha, MD  Potassium  Chloride CRYS Take 20 mEq by mouth 2 (two) times daily. With lasix 01/28/14   Sherren Mocha, MD  promethazine (PHENERGAN) 25 MG tablet Take 1 tablet (25 mg total) by mouth every 8 (eight) hours as needed for nausea or vomiting. 03/02/14   Sherren Mocha, MD  traMADol (ULTRAM) 50 MG tablet Take 1 tablet (50 mg total) by mouth every 8 (eight) hours as needed (for right knee pain). 03/02/14   Sherren Mocha, MD   BP 165/75 mmHg  Pulse 105  Temp(Src) 97.8 F (36.6 C) (Oral)  Resp 17  SpO2 98% Physical Exam    ED Course  Procedures (including critical care time) Labs Review Labs Reviewed - No data to display  Imaging Review No results found.   EKG Interpretation None      MDM   Final diagnoses:  None    Patient with multiple vague complaints, decreased appetite, nausea type symptoms, chest pressure, all associated with increased tension at home with her family.  Patient was most concerned about her blood pressure which was systolic 165 upon arrival to the ER. Patient has frequently high readings at her doctor's office visits however this was slightly higher than normal for her. Since being in the ER without any treatment her blood pressure is now 130 systolic.   History is hard to obtain, chest pain workup initiated, suspect this is likely related to situational stress on top of recent major depressive disorder which was diagnosed in the last 4 months, patient has recently started Zoloft with intermittent compliance.  Suspect during this time of increased tension and frequent altercations, patient may benefit from treatment for her situational anxiety. I have encouraged follow-up with her established psychiatrist/psychologist.    Initial troponin is negative, no signs of infection or abdominal pathology. I have updated the patient of her findings, her social worker is present in the room as well as sign language interpreter.  And encouraged follow-up with her therapist and available psych for  possible other treatments as situational anxiety and for therapy to help her increase her coping mechanisms. Patient states she has no SI no HI no A-V H, she has multiple resources to reach out to an case of increased anxiety or if she does not feel safe in her home with her family.   Filed Vitals:   12/02/14 1826 12/02/14 1830 12/02/14 1915 12/02/14 2014  BP: 145/75 139/69 130/82 130/67  Pulse: 92 90 79 89  Temp:      TempSrc:      Resp: SpO2: 94% 94% 97% 96%   Medications  sodium chloride 0.9 % bolus 1,000 mL (0 mLs Intravenous Stopped 12/02/14 2012)  ondansetron (ZOFRAN) injection 4 mg (4 mg Intravenous Given 12/02/14 1825)  sodium chloride 0.9 % bolus 1,000 mL (0 mLs Intravenous Stopped 12/02/14 2048)   Patient had multiple questions  about antianxiety medications, they're gone over at great length.  Both the pt, her therapist, pt social worker have multiple questions.  Over 45 minutes spent in exam room with extensive questions.     Danelle Berry, PA-C 12/03/14 0319  Samuel Jester, DO 12/05/14 [phone]

## 2014-12-09 ENCOUNTER — Encounter (HOSPITAL_COMMUNITY): Payer: Self-pay | Admitting: *Deleted

## 2014-12-09 ENCOUNTER — Encounter (HOSPITAL_COMMUNITY): Payer: Self-pay

## 2014-12-09 ENCOUNTER — Inpatient Hospital Stay (HOSPITAL_COMMUNITY)
Admission: AD | Admit: 2014-12-09 | Discharge: 2014-12-16 | DRG: 885 | Disposition: A | Discharge status: Home / Self Care | Payer: Federal, State, Local not specified - PPO | Source: Intra-hospital | Attending: Psychiatry | Admitting: Psychiatry

## 2014-12-09 ENCOUNTER — Emergency Department (HOSPITAL_COMMUNITY)
Admission: EM | Admit: 2014-12-09 | Discharge: 2014-12-09 | Disposition: A | Discharge status: Home / Self Care | Payer: Federal, State, Local not specified - PPO | Attending: Emergency Medicine | Admitting: Emergency Medicine

## 2014-12-09 DIAGNOSIS — G8929 Other chronic pain: Secondary | ICD-10-CM | POA: Diagnosis present

## 2014-12-09 DIAGNOSIS — F332 Major depressive disorder, recurrent severe without psychotic features: Secondary | ICD-10-CM | POA: Diagnosis present

## 2014-12-09 DIAGNOSIS — F419 Anxiety disorder, unspecified: Secondary | ICD-10-CM | POA: Diagnosis present

## 2014-12-09 DIAGNOSIS — F32A Depression, unspecified: Secondary | ICD-10-CM

## 2014-12-09 DIAGNOSIS — M25561 Pain in right knee: Secondary | ICD-10-CM | POA: Diagnosis present

## 2014-12-09 DIAGNOSIS — Z79899 Other long term (current) drug therapy: Secondary | ICD-10-CM | POA: Diagnosis not present

## 2014-12-09 DIAGNOSIS — G4700 Insomnia, unspecified: Secondary | ICD-10-CM | POA: Diagnosis present

## 2014-12-09 DIAGNOSIS — F329 Major depressive disorder, single episode, unspecified: Secondary | ICD-10-CM | POA: Diagnosis present

## 2014-12-09 DIAGNOSIS — H9190 Unspecified hearing loss, unspecified ear: Secondary | ICD-10-CM | POA: Diagnosis present

## 2014-12-09 DIAGNOSIS — Z880 Allergy status to penicillin: Secondary | ICD-10-CM | POA: Diagnosis not present

## 2014-12-09 DIAGNOSIS — R45851 Suicidal ideations: Secondary | ICD-10-CM | POA: Diagnosis present

## 2014-12-09 HISTORY — DX: Major depressive disorder, single episode, unspecified: F32.9

## 2014-12-09 HISTORY — DX: Depression, unspecified: F32.A

## 2014-12-09 HISTORY — DX: Anxiety disorder, unspecified: F41.9

## 2014-12-09 LAB — RAPID URINE DRUG SCREEN, HOSP PERFORMED
Amphetamines: NOT DETECTED
Barbiturates: NOT DETECTED
Benzodiazepines: NOT DETECTED
COCAINE: NOT DETECTED
Opiates: NOT DETECTED
Tetrahydrocannabinol: NOT DETECTED

## 2014-12-09 LAB — COMPREHENSIVE METABOLIC PANEL
ALK PHOS: 73 U/L (ref 38–126)
ALT: 14 U/L (ref 14–54)
ANION GAP: 10 (ref 5–15)
AST: 11 U/L — ABNORMAL LOW (ref 15–41)
Albumin: 4.1 g/dL (ref 3.5–5.0)
BUN: 19 mg/dL (ref 6–20)
CO2: 24 mmol/L (ref 22–32)
Calcium: 9.6 mg/dL (ref 8.9–10.3)
Chloride: 108 mmol/L (ref 101–111)
Creatinine, Ser: 1.14 mg/dL — ABNORMAL HIGH (ref 0.44–1.00)
GFR calc Af Amer: >60 mL/min (ref >60)
GFR calc non Af Amer: 54 mL/min — ABNORMAL LOW (ref >60)
Glucose, Bld: 112 mg/dL — ABNORMAL HIGH (ref 65–99)
Potassium: 3.7 mmol/L (ref 3.5–5.1)
Sodium: 142 mmol/L (ref 135–145)
Total Bilirubin: 0.7 mg/dL (ref 0.3–1.2)
Total Protein: 7.5 g/dL (ref 6.5–8.1)

## 2014-12-09 LAB — CBC
HEMATOCRIT: 45.4 % (ref 36.0–46.0)
Hemoglobin: 14.5 g/dL (ref 12.0–15.0)
MCH: 27.9 pg (ref 26.0–34.0)
MCHC: 31.9 g/dL (ref 30.0–36.0)
MCV: 87.5 fL (ref 78.0–100.0)
PLATELETS: 264 10*3/uL (ref 150–400)
RBC: 5.19 MIL/uL — AB (ref 3.87–5.11)
RDW: 15.1 % (ref 11.5–15.5)
WBC: 9 10*3/uL (ref 4.0–10.5)

## 2014-12-09 LAB — SALICYLATE LEVEL: Salicylate Lvl: <4 mg/dL (ref 2.8–30.0)

## 2014-12-09 LAB — ACETAMINOPHEN LEVEL: Acetaminophen (Tylenol), Serum: <10 ug/mL — ABNORMAL LOW (ref 10–30)

## 2014-12-09 LAB — ETHANOL: Alcohol, Ethyl (B): <5 mg/dL (ref <5)

## 2014-12-09 MED ORDER — ALUM & MAG HYDROXIDE-SIMETH 200-200-20 MG/5ML PO SUSP: 30.0000 mL | ORAL | Status: DC | PRN

## 2014-12-09 MED ORDER — TRAZODONE HCL 50 MG PO TABS
50.0000 mg | ORAL_TABLET | Freq: Every evening | ORAL | Status: DC | PRN
  Administered 2014-12-10 – 2014-12-15 (×6): 50 mg via ORAL
  Filled 2014-12-09 (×2): qty 1
  Filled 2014-12-09: qty 3
  Filled 2014-12-09 (×4): qty 1

## 2014-12-09 MED ORDER — ACETAMINOPHEN 325 MG PO TABS: 650.0000 mg | ORAL_TABLET | Freq: Four times a day (QID) | ORAL | Status: DC | PRN

## 2014-12-09 MED ORDER — MAGNESIUM HYDROXIDE 400 MG/5ML PO SUSP: 30.0000 mL | Freq: Every day | ORAL | Status: DC | PRN

## 2014-12-09 MED ORDER — LORAZEPAM 1 MG PO TABS: 1.0000 mg | ORAL_TABLET | Freq: Three times a day (TID) | ORAL | Status: DC | PRN

## 2014-12-09 MED ORDER — IBUPROFEN 200 MG PO TABS: 200.0000 mg | ORAL_TABLET | Freq: Four times a day (QID) | ORAL | Status: DC | PRN

## 2014-12-09 MED ORDER — SERTRALINE HCL 50 MG PO TABS: 75.0000 mg | ORAL_TABLET | Freq: Every day | ORAL | Status: DC

## 2014-12-09 MED ORDER — LATANOPROST 0.005 % OP SOLN
1.0000 [drp] | Freq: Every day | OPHTHALMIC | Status: DC
  Filled 2014-12-09: qty 2.5

## 2014-12-09 MED ORDER — HYDROXYZINE HCL 25 MG PO TABS: 25.0000 mg | ORAL_TABLET | Freq: Three times a day (TID) | ORAL | Status: DC | PRN

## 2014-12-09 MED ORDER — ONDANSETRON HCL 4 MG PO TABS: 4.0000 mg | ORAL_TABLET | Freq: Three times a day (TID) | ORAL | Status: DC | PRN

## 2014-12-09 MED ORDER — DORZOLAMIDE HCL 2 % OP SOLN
1.0000 [drp] | Freq: Two times a day (BID) | OPHTHALMIC | Status: DC
  Filled 2014-12-09: qty 10

## 2014-12-09 MED ORDER — PROMETHAZINE HCL 25 MG PO TABS: 25.0000 mg | ORAL_TABLET | Freq: Four times a day (QID) | ORAL | Status: DC | PRN

## 2014-12-09 NOTE — ED Provider Notes (Signed)
CSN: [id number]     Arrival date & time 12/09/14  1300 History  This chart was scribed for April Horseman, PA-C, working with [name], MD by [name], ED Scribe. This patient was seen in room [facility]4/[facility] and the patient's care was started at 1:37 PM.    Chief Complaint  Patient presents with  . Depression  . Suicidal     The history is provided by the patient. A language interpreter was used (Pt is deaf and a Sign Language interpreter was used).   HPI Comments: [name] is a 54 y.o. female who presents to the Emergency Department with a chief complaint of gradual worsening depression since December. Pt notes having intermittent suicidal ideations since this past December. She states this past December she thought about doing something to harm herself but changed her mind. She "does not know" what she planned to do. Pt is under a lot of family stress with her mother recently passing, health issues, and notes "it is like a war and I feel helpless". She currently sees a therapist but feels like it is not helping her. Pt reports she feels like others are pushing her down because she is deaf. Pt notes having other health issues such as knee pain and cataracts but is unable to have surgeries to fix them due to not having a job nor having the funds to pay for them. Pt is on medication for her depression and she denies any homicidal ideations.   Past Medical History  Diagnosis Date  . Deaf   . Chronic pain of right knee    Past Surgical History  Procedure Laterality Date  . Wisdom tooth extraction    . Abdominal hysterectomy    . Cholecystectomy     No family history on file. History  Substance Use Topics  . Smoking status: Never Smoker   . Smokeless tobacco: Not on file  . Alcohol Use: Not on file   OB History    No data available     Review of Systems  Musculoskeletal: Positive for arthralgias.  Psychiatric/Behavioral: Positive for suicidal ideas.       Allergies  Penicillins  Home Medications   Prior to Admission medications   Medication Sig Start Date End Date Taking? Authorizing Provider  ciprofloxacin (CIPRO) 500 MG tablet Take 1 tablet (500 mg total) by mouth 2 (two) times daily. Patient not taking: Reported on 12/02/2014 03/02/14   Sherren Mocha, MD  furosemide (LASIX) 40 MG tablet Take 1 tablet (40 mg total) by mouth 2 (two) times daily. Patient not taking: Reported on 12/02/2014 01/28/14   Sherren Mocha, MD  hydrOXYzine (ATARAX/VISTARIL) 25 MG tablet Take 1 tablet (25 mg total) by mouth every 8 (eight) hours as needed for anxiety. 12/02/14   Danelle Berry, PA-C  ibuprofen (ADVIL,MOTRIN) 200 MG tablet Take 200 mg by mouth every 6 (six) hours as needed for mild pain or moderate pain.    Historical Provider, MD  metroNIDAZOLE (FLAGYL) 500 MG tablet Take 1 tablet (500 mg total) by mouth 3 (three) times daily. Patient not taking: Reported on 12/02/2014 03/02/14   Sherren Mocha, MD  Potassium Chloride CRYS Take 20 mEq by mouth 2 (two) times daily. With lasix Patient not taking: Reported on 12/02/2014 01/28/14   Sherren Mocha, MD  promethazine (PHENERGAN) 25 MG suppository Place 1 suppository (25 mg total) rectally every 6 (six) hours as needed for nausea or vomiting. 12/02/14   Danelle Berry, PA-C  promethazine (PHENERGAN) 25 MG tablet Take 1 tablet (25 mg total) by mouth every 6 (six) hours as needed for nausea or vomiting. 12/02/14   Danelle Berry, PA-C  sertraline (ZOLOFT) 50 MG tablet Take 75 mg by mouth daily.    Historical Provider, MD  traMADol (ULTRAM) 50 MG tablet Take 1 tablet (50 mg total) by mouth every 8 (eight) hours as needed (for right knee pain). Patient not taking: Reported on 12/02/2014 03/02/14   Sherren Mocha, MD   Triage vitals: BP 158/92 mmHg  Pulse 94  Temp(Src) 98.3 F (36.8 C) (Oral)  Resp 16  SpO2 94%  Physical Exam  Constitutional: She is oriented to person, place, and time. She appears well-developed and well-nourished. No  distress.  HENT:  Head: Normocephalic and atraumatic.  Eyes: Conjunctivae and EOM are normal. Pupils are equal, round, and reactive to light. Right eye exhibits no discharge. Left eye exhibits no discharge.  Neck: Normal range of motion. Neck supple.  Cardiovascular: Normal rate and regular rhythm.  Exam reveals no gallop and no friction rub.   No murmur heard. Pulmonary/Chest: Effort normal and breath sounds normal. No respiratory distress. She has no wheezes. She has no rales. She exhibits no tenderness.  Abdominal: Soft. Bowel sounds are normal. She exhibits no distension and no mass. There is no tenderness. There is no rebound and no guarding.  Musculoskeletal: Normal range of motion. She exhibits no edema or tenderness.  Neurological: She is alert and oriented to person, place, and time. Coordination normal.  Skin: Skin is warm and dry. No rash noted. She is not diaphoretic.  Psychiatric: She has a normal mood and affect. Her behavior is normal. Judgment and thought content normal.  Nursing note and vitals reviewed.   ED Course  Procedures  DIAGNOSTIC STUDIES: Oxygen Saturation is 94% on RA, low by my interpretation.  COORDINATION OF CARE:  1:44 PM Discussed treatment plan which includes lab work and speak with [facility] specialist with pt at bedside and pt agreed to plan.  Results for orders placed or performed during the hospital encounter of 12/09/14  Comprehensive metabolic panel  Result Value Ref Range   Sodium 142 135 - 145 mmol/L   Potassium 3.7 3.5 - 5.1 mmol/L   Chloride 108 101 - 111 mmol/L   CO2 24 22 - 32 mmol/L   Glucose, Bld 112 (H) 65 - 99 mg/dL   BUN 19 6 - 20 mg/dL   Creatinine, Ser 1.61 (H) 0.44 - 1.00 mg/dL   Calcium 9.6 8.9 - 09.6 mg/dL   Total Protein 7.5 6.5 - 8.1 g/dL   Albumin 4.1 3.5 - 5.0 g/dL   AST 11 (L) 15 - 41 U/L   ALT 14 14 - 54 U/L   Alkaline Phosphatase 73 38 - 126 U/L   Total Bilirubin 0.7 0.3 - 1.2 mg/dL   GFR calc non Af Amer 54 (L) >60  mL/min   GFR calc Af Amer >60 >60 mL/min   Anion gap 10 5 - 15  Ethanol (ETOH)  Result Value Ref Range   Alcohol, Ethyl (B) <5 <5 mg/dL  Salicylate level  Result Value Ref Range   Salicylate Lvl <4.0 2.8 - 30.0 mg/dL  Acetaminophen level  Result Value Ref Range   Acetaminophen (Tylenol), Serum <10 (L) 10 - 30 ug/mL  CBC  Result Value Ref Range   WBC 9.0 4.0 - 10.5 K/uL   RBC 5.19 (H) 3.87 - 5.11 MIL/uL   Hemoglobin 14.5 12.0 -  15.0 g/dL   HCT 16.1 09.6 - 04.5 %   MCV 87.5 78.0 - 100.0 fL   MCH 27.9 26.0 - 34.0 pg   MCHC 31.9 30.0 - 36.0 g/dL   RDW 40.9 81.1 - 91.4 %   Platelets 264 150 - 400 K/uL  Urine rapid drug screen (hosp performed) (Not at [facility])  Result Value Ref Range   Opiates NONE DETECTED NONE DETECTED   Cocaine NONE DETECTED NONE DETECTED   Benzodiazepines NONE DETECTED NONE DETECTED   Amphetamines NONE DETECTED NONE DETECTED   Tetrahydrocannabinol NONE DETECTED NONE DETECTED   Barbiturates NONE DETECTED NONE DETECTED   Dg Chest 2 View  12/02/2014   CLINICAL DATA:  Chest pressure for several days. History of hypertension.  EXAM: CHEST  2 VIEW  COMPARISON:  07/30/2006.  FINDINGS: Poor inspiration. Stable borderline enlarged cardiac silhouette. Clear lungs. Thoracic spine degenerative changes. Cholecystectomy clips.  IMPRESSION: No acute abnormality.   Electronically Signed   By: Beckie Salts M.D.   On: 12/02/2014 19:12     No results found.   EKG Interpretation None      MDM   Final diagnoses:  Depression    Patient here with complaint of depression and suicidal ideation.  Patient seen by and discussed with Dr. Ethelda Chick.  TTS consult ordered.  Patient will be transferred to [facility].   I personally performed the services described in this documentation, which was scribed in my presence. The recorded information has been reviewed and is accurate.    April Horseman, PA-C 12/09/14 1708  Doug Sou, MD 12/10/14 1059

## 2014-12-09 NOTE — Plan of Care (Signed)
Problem: Alteration in mood Goal: LTG-Patient reports reduction in suicidal thoughts (Patient reports reduction in suicidal thoughts and is able to verbalize a safety plan for whenever patient is feeling suicidal) Outcome: Progressing Per interpreter, pt denied SI this shift.  She also contracted for safety with Clinical research associate via interpreter.
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
## 2014-12-09 NOTE — Progress Notes (Signed)
Attended Karaoke group tonight. 

## 2014-12-09 NOTE — ED Provider Notes (Addendum)
Patient is deaf. History is obtained from patient using a professional interpreter using sign language. History is obtained from patient and from patient's daughter. Patient has history of long-standing depression for the past several years. She feels particularly emotionally stressed and helpless due to lack of job and lack of income. She lives with her daughter until recently, did not pay bills so her daughter reports "we had to part ways". When asking patient specifically suicidal or wants to harm herself she does not give a definitive yes or no answer. She denies alcohol abuse or drug use. She had noted definite suicidal plan presently. TTS and psychiatry consult to evaluate patient. Results for orders placed or performed during the hospital encounter of 12/09/14  Comprehensive metabolic panel  Result Value Ref Range   Sodium 142 135 - 145 mmol/L   Potassium 3.7 3.5 - 5.1 mmol/L   Chloride 108 101 - 111 mmol/L   CO2 24 22 - 32 mmol/L   Glucose, Bld 112 (H) 65 - 99 mg/dL   BUN 19 6 - 20 mg/dL   Creatinine, Ser 1.61 (H) 0.44 - 1.00 mg/dL   Calcium 9.6 8.9 - 09.6 mg/dL   Total Protein 7.5 6.5 - 8.1 g/dL   Albumin 4.1 3.5 - 5.0 g/dL   AST 11 (L) 15 - 41 U/L   ALT 14 14 - 54 U/L   Alkaline Phosphatase 73 38 - 126 U/L   Total Bilirubin 0.7 0.3 - 1.2 mg/dL   GFR calc non Af Amer 54 (L) >60 mL/min   GFR calc Af Amer >60 >60 mL/min   Anion gap 10 5 - 15  Ethanol (ETOH)  Result Value Ref Range   Alcohol, Ethyl (B) <5 <5 mg/dL  Salicylate level  Result Value Ref Range   Salicylate Lvl <4.0 2.8 - 30.0 mg/dL  Acetaminophen level  Result Value Ref Range   Acetaminophen (Tylenol), Serum <10 (L) 10 - 30 ug/mL  CBC  Result Value Ref Range   WBC 9.0 4.0 - 10.5 K/uL   RBC 5.19 (H) 3.87 - 5.11 MIL/uL   Hemoglobin 14.5 12.0 - 15.0 g/dL   HCT 04.5 40.9 - 81.1 %   MCV 87.5 78.0 - 100.0 fL   MCH 27.9 26.0 - 34.0 pg   MCHC 31.9 30.0 - 36.0 g/dL   RDW 91.4 78.2 - 95.6 %   Platelets 264 150 - 400  K/uL   Dg Chest 2 View  12/02/2014   CLINICAL DATA:  Chest pressure for several days. History of hypertension.  EXAM: CHEST  2 VIEW  COMPARISON:  07/30/2006.  FINDINGS: Poor inspiration. Stable borderline enlarged cardiac silhouette. Clear lungs. Thoracic spine degenerative changes. Cholecystectomy clips.  IMPRESSION: No acute abnormality.   Electronically Signed   By: Beckie Salts M.D.   On: 12/02/2014 19:12     Doug Sou, MD 12/09/14 1512  Pt to be admitted to [facility], MD 12/09/14 1649

## 2014-12-09 NOTE — ED Notes (Signed)
Per NS, Sign Language interpreter will be here in 10 minutes.

## 2014-12-09 NOTE — ED Notes (Signed)
While waiting for the sign language interpreter, this RN spoke w/ the Pt's daughter.  Daughter sts "last week she told her brother that she wanted to kill herself to be with her mother in heaven, but she does not have a plan.  She has said this to several people in the past.  She has been stressed out, due to no income, no car, nowhere to live, and few frequent disagreements w/ family members.  She is living in her mother's home, but she was supposed to be moved out by today.  She has known this since March and hasn't done anything about it.  She has a sister that feeds into her issues and gives her false hope.  Her therapist doesn't want [place] around, because she thinks we make it worse.  The biggest problem is that she lies to the therapist."  Pt is currently being treated for anxiety and depression.  Denies HI/AV.

## 2014-12-09 NOTE — BH Assessment (Signed)
[facility] Assessment Progress Note  Per Dahlia Byes, NP, this pt requires psychiatric hospitalization at this time.  Thurman Coyer, RN, [facility] has assigned pt to Rm [id number].  Pt is hearing impaired; Glee Arvin provides interpreter services during my communication with the pt.  Pt has signed Voluntary Admission and Consent for Treatment, as well as Consent to Release Information to her providers at [facility] in [facility], and a notification call has been placed.  Signed forms have been faxed to [facility].  Pt's nurse has been notified, and agrees to send original paperwork along with pt via [facility], and to call report to [phone].  Doylene Canning, MA Triage Specialist [phone]

## 2014-12-09 NOTE — ED Notes (Signed)
Verbalized understanding discharge instructions. In no acute distress.  Pt transported to [facility] by [facility].  Interpreter to follow.

## 2014-12-09 NOTE — ED Notes (Addendum)
Delay lab draw edp in with pt

## 2014-12-09 NOTE — ED Notes (Signed)
Pt is hearing impaired.  NS asked to call sign language interpreter.

## 2014-12-09 NOTE — Tx Team (Signed)
Initial Interdisciplinary Treatment Plan   PATIENT STRESSORS: Financial difficulties Marital or family conflict Traumatic event   PATIENT STRENGTHS: Ability for insight Average or above average intelligence Supportive family/friends   PROBLEM LIST: Problem List/Patient Goals Date to be addressed Date deferred Reason deferred Estimated date of resolution  Pt stated,"I have lost my house and my car got repossed."      Pt stated,"Her sister, Bonita Quin.is trying to be her power of attorney and pt stated,"I do nto need that."      Pt stated,"my mom died in 29-Jul-2022 ."                                           DISCHARGE CRITERIA:  Ability to meet basic life and health needs Motivation to continue treatment in a less acute level of care Safe-care adequate arrangements made  PRELIMINARY DISCHARGE PLAN: Attend aftercare/continuing care group Outpatient therapy Placement in alternative living arrangements  PATIENT/FAMIILY INVOLVEMENT: This treatment plan has been presented to and reviewed with the patient, [name], and/or family memberThe patient has been given the opportunity to ask questions and make suggestions.  [name] [facility] 12/09/2014, 7:09 PM

## 2014-12-09 NOTE — Tx Team (Signed)
Initial Interdisciplinary Treatment Plan   PATIENT STRESSORS: Financial difficulties Marital or family conflict Traumatic event   PATIENT STRENGTHS: Active sense of humor Average or above average intelligence Motivation for treatment/growth   PROBLEM LIST: Problem List/Patient Goals Date to be addressed Date deferred Reason deferred Estimated date of resolution  Pt stated,"I do not know where I will live now.""I have no place to go and the shelters are all full."      Pts sister, Bonita Quin is trying to become the pts power of attorney and pt wants no part of this.      Pt stated,"my mother died in Jul 24, 2022 and I was very close to her."                                           DISCHARGE CRITERIA:  Ability to meet basic life and health needs Motivation to continue treatment in a less acute level of care Safe-care adequate arrangements made  PRELIMINARY DISCHARGE PLAN: Outpatient therapy Placement in alternative living arrangements  PATIENT/FAMIILY INVOLVEMENT: This treatment plan has been presented to and reviewed with the patient, [name], .  The patient has been given the opportunity to ask questions and make suggestions.  Rodman Key [facility] 12/09/2014, 6:55 PM

## 2014-12-09 NOTE — Progress Notes (Signed)
Patient ID: [name], female   DOB: 1960-08-29, 54 y.o.   MRN: [id number]  Pt is a 54 year old voluntary admit first time to [facility]. She has allergies to PCN . Her medical history includes: DEAF, chronic pain of the right knee, anxiety , depression , hysterectomy and gallbladder removal. Pt presents to the ER with an interpreter. She admits to feeling hopeless after her mom died in 2022-07-23 and she has been told she and her brother will no longer be able to live in the house. Pt had her car reposessed and has no place to go. She went to the [facility] and was told the shelters are full. Pt is very depressed and more so now finding out that her sister, Bonita Quin, has hired an Pensions consultant claiming she needs to be the pts Power of attorney. Pt does not want any contact with her sister at all. Pt presently has an interpreter sitting with her  while she is eating dinner. Pt was shown to her room and given toiltrees. She denies Si and HI and contracts for safety. Pt denies tobacco use, ETOH usage or drug usage.

## 2014-12-09 NOTE — ED Notes (Signed)
Pt c/o depression and SI w/o a plan.  Pt reports feeling "hopeless and oppressed by her family, due to being deaf."  Sts she contemplated wrecking her and killing herself last December.  Denies HI/AV.

## 2014-12-09 NOTE — Progress Notes (Signed)
D: Communicated with pt using interpreter. According to interpreter, pt reports "I'm okay."  Pt reports she came in today for "depression I guess, they just said I needed help."  When asked if pt is suicidal or homicidal, pt reports "before I did try to kill myself."  Pt denies SI/HI at this time.  When asked if pt is having hallucinations, pt reports "after my mom died, I felt like I saw her twice in my kitchen, recently when I'm at home I just feel like her spirits around, I feel this coolness in the air, something feels different."  Pt denies pain.  Pt attended part of evening group and made a telephone call using interpreter.   A: Introduced self to pt.  Encouraged pt to report needs and concerns.  Developed plan to communicate with pt by writing when interpreter leaves.  Explained to pt that she has no scheduled medications at this time.  Pt provided with beverage per request.  Pt had NCID and BCBS card with her.  These cards were locked in locker 58; security guard signed as witness.   R: Pt contracts for safety via interpreter.  She reports that she will notify staff of needs and concerns.  She returned from evening group because she said "karaoke isn't really my thing since I'm deaf."  Will continue to monitor and assess for safety.

## 2014-12-10 ENCOUNTER — Encounter (HOSPITAL_COMMUNITY): Payer: Self-pay | Admitting: Psychiatry

## 2014-12-10 DIAGNOSIS — R45851 Suicidal ideations: Secondary | ICD-10-CM

## 2014-12-10 MED ORDER — SERTRALINE HCL 25 MG PO TABS
75.0000 mg | ORAL_TABLET | Freq: Every day | ORAL | Status: DC
  Administered 2014-12-10 – 2014-12-11 (×2): 75 mg via ORAL
  Filled 2014-12-10 (×4): qty 3

## 2014-12-10 MED ORDER — HYDROXYZINE HCL 25 MG PO TABS
25.0000 mg | ORAL_TABLET | Freq: Four times a day (QID) | ORAL | Status: DC | PRN
  Administered 2014-12-12 – 2014-12-15 (×4): 25 mg via ORAL
  Filled 2014-12-10 (×4): qty 1

## 2014-12-10 MED ORDER — IBUPROFEN 400 MG PO TABS
400.0000 mg | ORAL_TABLET | Freq: Four times a day (QID) | ORAL | Status: DC | PRN
  Administered 2014-12-10 – 2014-12-12 (×3): 400 mg via ORAL
  Filled 2014-12-10 (×3): qty 1

## 2014-12-10 NOTE — [facility] Suicide Risk Assessment (Signed)
[facility] INPATIENT:  Family/Significant Other Suicide Prevention Education  Suicide Prevention Education:  Education Completed; Marchelle Folks, Pt's daughter [phone]), has been identified by the patient as the family member/significant other with whom the patient will be residing, and identified as the person(s) who will aid the patient in the event of a mental health crisis (suicidal ideations/suicide attempt).  With written consent from the patient, the family member/significant other has been provided the following suicide prevention education, prior to the and/or following the discharge of the patient.  The suicide prevention education provided includes the following:  Suicide risk factors  Suicide prevention and interventions  National Suicide Hotline telephone number  [facility] assessment telephone number  [facility] Emergency Assistance 911  [facility] and/or Residential Mobile Crisis Unit telephone number  Request made of family/significant other to:  Remove weapons (e.g., guns, rifles, knives), all items previously/currently identified as safety concern.    Remove drugs/medications (over-the-counter, prescriptions, illicit drugs), all items previously/currently identified as a safety concern.  The family member/significant other verbalizes understanding of the suicide prevention education information provided.  The family member/significant other agrees to remove the items of safety concern listed above.  Elaina Hoops 12/10/2014, 5:17 PM

## 2014-12-10 NOTE — [facility] Group Notes (Signed)
[facility] LCSW Aftercare Discharge Planning Group Note  12/10/2014 8:45 AM  Participation Quality: Alert, Appropriate and Oriented  Mood/Affect: Appropriate  Depression Rating: "very bad, I don't know a number"  Anxiety Rating: 4  Thoughts of Suicide: "I'm not sure"  Will you contract for safety? Yes  Current AVH: Pt denies  Plan for Discharge/Comments: Pt attended discharge planning group and actively participated in group. CSW discussed suicide prevention education with the group and encouraged them to discuss discharge planning and any relevant barriers. Pt reports that her depression has worsened since her mother passed away in 07/30/2022. Her main concern is uncertain housing at this point.   Transportation Means: Pt reports access to transportation  Supports: No supports mentioned at this time  Chad Cordial, LCSWA 12/10/2014 12:12 PM

## 2014-12-10 NOTE — [facility] Group Notes (Signed)
[facility] LCSW Group Therapy 12/10/2014 1:15pm  Type of Therapy: Group Therapy- Feelings Around Relapse and Recovery  Participation Level: Active   Participation Quality:  Appropriate  Affect:  Appropriate  Cognitive: Alert and Oriented   Insight:  Developing   Engagement in Therapy: Developing/Improving and Engaged   Modes of Intervention: Clarification, Confrontation, Discussion, Education, Exploration, Limit-setting, Orientation, Problem-solving, Rapport Building, Dance movement psychotherapist, Socialization and Support  Summary of Progress/Problems: The topic for today was feelings about relapse. The group discussed what relapse prevention is to them and identified triggers that they are on the path to relapse. Members also processed their feeling towards relapse and were able to relate to common experiences. Group also discussed coping skills that can be used for relapse prevention.  Pt reports that she feels she has little family support which makes coping with her depression more difficult. Pt also posed the question whether she should "follow her heart or mind" because she does not know which one to trust at times. She was attentive during group discussion and participated willingly.   Therapeutic Modalities:   Cognitive Behavioral Therapy Solution-Focused Therapy Assertiveness Training Relapse Prevention Therapy    [name] [phone] 12/10/2014 3:57 PM

## 2014-12-10 NOTE — H&P (Addendum)
Psychiatric Admission Assessment Adult  Patient Identification: [name] MRN:  [id number] Date of Evaluation:  12/10/2014 Chief Complaint:  "I have been very depressed."  Principal Diagnosis: Recurrent major depression-severe Diagnosis:   Patient Active Problem List   Diagnosis Date Noted  . Recurrent major depression-severe [F33.2] 12/09/2014  . Difficulty hearing [H91.90] 03/02/2014   History of Present Illness::   [name] is a 54 year old female who presented to the [facility] with complaints of worsening depression. She has a history of being deaf and has had an interpreter to assist with communication. Arlyss reported in the ED that her depression has worsened since her mother passed away four months ago. Patient stated through help from hospital interpreter "I'm very depressed. I did not think about killing myself. It goes back to November. I started being more withdrawn like I would not even answer the door. 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 I miss working. I worked for 29 years. I don't really have hobbies because working was my life. Things got worse after my mother died in 28-Jul-2022. My family does not communicate with me because I am deaf. I feel guilty about my mother even though I know she was sick for many years. It's not easy still living in her house with my brother. He said we have to be out by the end of this month. I don't know what will happen now.  I am worried about my family situation. My daughter is buys with her kids and I feel helpless. I cry a lot and feel very fatigued. I was started on Zoloft in May and I started to feel less withdrawn. It has helped me. But sleep is still a problem. I wake up at 3 am and can't go back to sleep. I am scared to be here. I have never been in a mental health hospital. I do not hear voices. At times I thought I saw my mother's shadow after she passed. But that is not happening now. I will try to get  Disability but that can take years." Alayja participated well during the assessment but appeared depressed. Denies any alcohol or drug use. Patient reports only having taken Zoloft in the past. She reports a positive response and is open to increasing the dose of the medication. Patient was also hopeful that the social worker could help her improve communication with her family members.   Elements:  Location:  depression. Quality:  tearful, depressed mood, lack of interest, suicidal thoughts. Severity:  Severe. Timing:  Since last year . Duration:  "Been depressed for a long time." . Context:  bereavement, stressors, facing uncertain living situation. Associated Signs/Symptoms: Depression Symptoms:  depressed mood, anhedonia, insomnia, psychomotor retardation, feelings of worthlessness/guilt, difficulty concentrating, hopelessness, impaired memory, recurrent thoughts of death, suicidal thoughts with specific plan, anxiety, loss of energy/fatigue, disturbed sleep, (Hypo) Manic Symptoms:  Denies Anxiety Symptoms:  Excessive Worry, Psychotic Symptoms:  Denies PTSD Symptoms: Denies Total Time spent with patient: 1 hour  Past Medical History:  Past Medical History  Diagnosis Date  . Deaf   . Chronic pain of right knee   . Anxiety   . Depression     Past Surgical History  Procedure Laterality Date  . Wisdom tooth extraction    . Abdominal hysterectomy    . Cholecystectomy     Family History: History reviewed. No pertinent family history. Social History:  History  Alcohol Use: Not on  file     History  Drug Use Not on file    History   Social History  . Marital Status: Single    Spouse Name: N/A  . Number of Children: N/A  . Years of Education: N/A   Social History Main Topics  . Smoking status: Never Smoker   . Smokeless tobacco: Not on file  . Alcohol Use: Not on file  . Drug Use: Not on file  . Sexual Activity: Not on file   Other Topics Concern  . None    Social History Narrative   Additional Social History:                          Musculoskeletal: Strength & Muscle Tone: within normal limits Gait & Station: normal Patient leans: N/A  Psychiatric Specialty Exam: Physical Exam  Constitutional:  Physical exam findings reviewed from the [facility] and I concur with no exceptions.     Review of Systems  Constitutional: Negative for fever, chills and weight loss.  HENT: Negative for hearing loss.   Eyes: Negative for blurred vision and double vision.  Respiratory: Negative for cough and hemoptysis.   Cardiovascular: Negative for chest pain and palpitations.  Gastrointestinal: Negative for heartburn, nausea and abdominal pain.  Genitourinary: Negative for dysuria, urgency, frequency and hematuria.  Musculoskeletal: Positive for joint pain.       Reports chronic knee pain.   Skin: Negative for itching and rash.  Neurological: Negative for seizures and headaches.  Endo/Heme/Allergies: Negative for polydipsia.  Psychiatric/Behavioral: Positive for depression and suicidal ideas. Negative for hallucinations, memory loss and substance abuse. The patient is nervous/anxious and has insomnia.     Blood pressure 135/99, pulse 100, temperature 97.4 F (36.3 C), temperature source Oral, resp. rate 16, height '5\' 5"'  (1.651 m), weight 146.965 kg (324 lb), SpO2 99 %.Body mass index is 53.92 kg/(m^2).  General Appearance: Casual  Eye Contact::  Good  Speech:  Unable to assess due to patient being deaf   Volume:  N/A  Mood:  Dysphoric  Affect:  Anxious   Thought Process:  Goal Directed and Intact  Orientation:  Full (Time, Place, and Person)  Thought Content:  Symptoms, Worries, Concerns   Suicidal Thoughts:  Yes.  without intent/plan  Homicidal Thoughts:  No  Memory:  Immediate;   Good Recent;   Good Remote;   Good  Judgement:  Fair  Insight:  Present  Psychomotor Activity:  Decreased  Concentration:  Good  Recall:  Good  Fund of  Knowledge:Good  Language: Good  Akathisia:  No  Handed:  Right  AIMS (if indicated):     Assets:  Communication Skills Desire for Improvement Leisure Time Resilience  ADL's:  Intact  Cognition: WNL  Sleep:  Number of Hours: 5   Risk to Self: Is patient at risk for suicide?: Yes What has been your use of drugs/alcohol within the last 12 months?: Pt denies Risk to Others:   Prior Inpatient Therapy:   Prior Outpatient Therapy:    Alcohol Screening: Patient refused Alcohol Screening Tool: Yes 1. How often do you have a drink containing alcohol?: Never 9. Have you or someone else been injured as a result of your drinking?: No 10. Has a relative or friend or a doctor or another health worker been concerned about your drinking or suggested you cut down?: No Alcohol Use Disorder Identification Test Final Score (AUDIT): 0 Brief Intervention: AUDIT score less than 7 or  less-screening does not suggest unhealthy drinking-brief intervention not indicated  Allergies:   Allergies  Allergen Reactions  . Penicillins Rash   Lab Results:  Results for orders placed or performed during the hospital encounter of 12/09/14 (from the past 48 hour(s))  Comprehensive metabolic panel     Status: Abnormal   Collection Time: 12/09/14  2:14 PM  Result Value Ref Range   Sodium 142 135 - 145 mmol/L   Potassium 3.7 3.5 - 5.1 mmol/L   Chloride 108 101 - 111 mmol/L   CO2 24 22 - 32 mmol/L   Glucose, Bld 112 (H) 65 - 99 mg/dL   BUN 19 6 - 20 mg/dL   Creatinine, Ser 1.14 (H) 0.44 - 1.00 mg/dL   Calcium 9.6 8.9 - 10.3 mg/dL   Total Protein 7.5 6.5 - 8.1 g/dL   Albumin 4.1 3.5 - 5.0 g/dL   AST 11 (L) 15 - 41 U/L   ALT 14 14 - 54 U/L   Alkaline Phosphatase 73 38 - 126 U/L   Total Bilirubin 0.7 0.3 - 1.2 mg/dL   GFR calc non Af Amer 54 (L) >60 mL/min   GFR calc Af Amer >60 >60 mL/min    Comment: (NOTE) The eGFR has been calculated using the CKD EPI equation. This calculation has not been validated in all  clinical situations. eGFR's persistently <60 mL/min signify possible Chronic Kidney Disease.    Anion gap 10 5 - 15  CBC     Status: Abnormal   Collection Time: 12/09/14  2:14 PM  Result Value Ref Range   WBC 9.0 4.0 - 10.5 K/uL   RBC 5.19 (H) 3.87 - 5.11 MIL/uL   Hemoglobin 14.5 12.0 - 15.0 g/dL   HCT 45.4 36.0 - 46.0 %   MCV 87.5 78.0 - 100.0 fL   MCH 27.9 26.0 - 34.0 pg   MCHC 31.9 30.0 - 36.0 g/dL   RDW 15.1 11.5 - 15.5 %   Platelets 264 150 - 400 K/uL  Ethanol (ETOH)     Status: None   Collection Time: 12/09/14  2:15 PM  Result Value Ref Range   Alcohol, Ethyl (B) <5 <5 mg/dL    Comment:        LOWEST DETECTABLE LIMIT FOR SERUM ALCOHOL IS 5 mg/dL FOR MEDICAL PURPOSES ONLY   Salicylate level     Status: None   Collection Time: 12/09/14  2:15 PM  Result Value Ref Range   Salicylate Lvl <5.8 2.8 - 30.0 mg/dL  Acetaminophen level     Status: Abnormal   Collection Time: 12/09/14  2:15 PM  Result Value Ref Range   Acetaminophen (Tylenol), Serum <10 (L) 10 - 30 ug/mL    Comment:        THERAPEUTIC CONCENTRATIONS VARY SIGNIFICANTLY. A RANGE OF 10-30 ug/mL MAY BE AN EFFECTIVE CONCENTRATION FOR MANY PATIENTS. HOWEVER, SOME ARE BEST TREATED AT CONCENTRATIONS OUTSIDE THIS RANGE. ACETAMINOPHEN CONCENTRATIONS >150 ug/mL AT 4 HOURS AFTER INGESTION AND >50 ug/mL AT 12 HOURS AFTER INGESTION ARE OFTEN ASSOCIATED WITH TOXIC REACTIONS.   Urine rapid drug screen (hosp performed) (Not at [facility])     Status: None   Collection Time: 12/09/14  3:50 PM  Result Value Ref Range   Opiates NONE DETECTED NONE DETECTED   Cocaine NONE DETECTED NONE DETECTED   Benzodiazepines NONE DETECTED NONE DETECTED   Amphetamines NONE DETECTED NONE DETECTED   Tetrahydrocannabinol NONE DETECTED NONE DETECTED   Barbiturates NONE DETECTED NONE DETECTED    Comment:  DRUG SCREEN FOR MEDICAL PURPOSES ONLY.  IF CONFIRMATION IS NEEDED FOR ANY PURPOSE, NOTIFY LAB WITHIN 5 DAYS.        LOWEST  DETECTABLE LIMITS FOR URINE DRUG SCREEN Drug Class       Cutoff (ng/mL) Amphetamine      1000 Barbiturate      200 Benzodiazepine   861 Tricyclics       683 Opiates          300 Cocaine          300 THC              50    Current Medications: Current Facility-Administered Medications  Medication Dose Route Frequency Provider Last Rate Last Dose  . acetaminophen (TYLENOL) tablet 650 mg  650 mg Oral Q6H PRN Kerrie Buffalo, NP      . alum & mag hydroxide-simeth (MAALOX/MYLANTA) 200-200-20 MG/5ML suspension 30 mL  30 mL Oral Q4H PRN Kerrie Buffalo, NP      . hydrOXYzine (ATARAX/VISTARIL) tablet 25 mg  25 mg Oral Q6H PRN Niel Hummer, NP      . magnesium hydroxide (MILK OF MAGNESIA) suspension 30 mL  30 mL Oral Daily PRN Kerrie Buffalo, NP      . sertraline (ZOLOFT) tablet 75 mg  75 mg Oral Daily Niel Hummer, NP   75 mg at 12/10/14 1303  . traZODone (DESYREL) tablet 50 mg  50 mg Oral QHS PRN Kerrie Buffalo, NP       PTA Medications: Prescriptions prior to admission  Medication Sig Dispense Refill Last Dose  . ciprofloxacin (CIPRO) 500 MG tablet Take 1 tablet (500 mg total) by mouth 2 (two) times daily. (Patient not taking: Reported on 12/02/2014) 14 tablet 0 Unknown at Unknown time  . dorzolamide (TRUSOPT) 2 % ophthalmic solution Place 1 drop into both eyes 2 (two) times daily.   Unknown at Unknown time  . furosemide (LASIX) 40 MG tablet Take 1 tablet (40 mg total) by mouth 2 (two) times daily. (Patient not taking: Reported on 12/02/2014) 60 tablet 0 Unknown at Unknown time  . hydrOXYzine (ATARAX/VISTARIL) 25 MG tablet Take 1 tablet (25 mg total) by mouth every 8 (eight) hours as needed for anxiety. 12 tablet 0 Unknown at Unknown time  . ibuprofen (ADVIL,MOTRIN) 200 MG tablet Take 200 mg by mouth every 6 (six) hours as needed for mild pain or moderate pain.   Unknown at Unknown time  . ibuprofen (ADVIL,MOTRIN) 200 MG tablet Take 400 mg by mouth every 6 (six) hours as needed for moderate  pain.   Unknown at Unknown time  . latanoprost (XALATAN) 0.005 % ophthalmic solution Place 1 drop into both eyes at bedtime.   Unknown at Unknown time  . metroNIDAZOLE (FLAGYL) 500 MG tablet Take 1 tablet (500 mg total) by mouth 3 (three) times daily. (Patient not taking: Reported on 12/02/2014) 21 tablet 0 Unknown at Unknown time  . Potassium Chloride CRYS Take 20 mEq by mouth 2 (two) times daily. With lasix (Patient not taking: Reported on 12/02/2014) 1200 g 0 Unknown at Unknown time  . promethazine (PHENERGAN) 25 MG suppository Place 1 suppository (25 mg total) rectally every 6 (six) hours as needed for nausea or vomiting. (Patient not taking: Reported on 12/09/2014) 12 each 0 Unknown at Unknown time  . promethazine (PHENERGAN) 25 MG tablet Take 1 tablet (25 mg total) by mouth every 6 (six) hours as needed for nausea or vomiting. 12 tablet 0 Unknown at Unknown time  .  sertraline (ZOLOFT) 50 MG tablet Take 75 mg by mouth daily.   Unknown at Unknown time  . traMADol (ULTRAM) 50 MG tablet Take 1 tablet (50 mg total) by mouth every 8 (eight) hours as needed (for right knee pain). (Patient not taking: Reported on 12/02/2014) 30 tablet 0 Unknown at Unknown time    Previous Psychotropic Medications: Yes  Started on Zoloft in May  Substance Abuse History in the last 12 months:  No.    Consequences of Substance Abuse: Negative  Results for orders placed or performed during the hospital encounter of 12/09/14 (from the past 72 hour(s))  Comprehensive metabolic panel     Status: Abnormal   Collection Time: 12/09/14  2:14 PM  Result Value Ref Range   Sodium 142 135 - 145 mmol/L   Potassium 3.7 3.5 - 5.1 mmol/L   Chloride 108 101 - 111 mmol/L   CO2 24 22 - 32 mmol/L   Glucose, Bld 112 (H) 65 - 99 mg/dL   BUN 19 6 - 20 mg/dL   Creatinine, Ser 1.14 (H) 0.44 - 1.00 mg/dL   Calcium 9.6 8.9 - 10.3 mg/dL   Total Protein 7.5 6.5 - 8.1 g/dL   Albumin 4.1 3.5 - 5.0 g/dL   AST 11 (L) 15 - 41 U/L   ALT 14 14  - 54 U/L   Alkaline Phosphatase 73 38 - 126 U/L   Total Bilirubin 0.7 0.3 - 1.2 mg/dL   GFR calc non Af Amer 54 (L) >60 mL/min   GFR calc Af Amer >60 >60 mL/min    Comment: (NOTE) The eGFR has been calculated using the CKD EPI equation. This calculation has not been validated in all clinical situations. eGFR's persistently <60 mL/min signify possible Chronic Kidney Disease.    Anion gap 10 5 - 15  CBC     Status: Abnormal   Collection Time: 12/09/14  2:14 PM  Result Value Ref Range   WBC 9.0 4.0 - 10.5 K/uL   RBC 5.19 (H) 3.87 - 5.11 MIL/uL   Hemoglobin 14.5 12.0 - 15.0 g/dL   HCT 45.4 36.0 - 46.0 %   MCV 87.5 78.0 - 100.0 fL   MCH 27.9 26.0 - 34.0 pg   MCHC 31.9 30.0 - 36.0 g/dL   RDW 15.1 11.5 - 15.5 %   Platelets 264 150 - 400 K/uL  Ethanol (ETOH)     Status: None   Collection Time: 12/09/14  2:15 PM  Result Value Ref Range   Alcohol, Ethyl (B) <5 <5 mg/dL    Comment:        LOWEST DETECTABLE LIMIT FOR SERUM ALCOHOL IS 5 mg/dL FOR MEDICAL PURPOSES ONLY   Salicylate level     Status: None   Collection Time: 12/09/14  2:15 PM  Result Value Ref Range   Salicylate Lvl <7.0 2.8 - 30.0 mg/dL  Acetaminophen level     Status: Abnormal   Collection Time: 12/09/14  2:15 PM  Result Value Ref Range   Acetaminophen (Tylenol), Serum <10 (L) 10 - 30 ug/mL    Comment:        THERAPEUTIC CONCENTRATIONS VARY SIGNIFICANTLY. A RANGE OF 10-30 ug/mL MAY BE AN EFFECTIVE CONCENTRATION FOR MANY PATIENTS. HOWEVER, SOME ARE BEST TREATED AT CONCENTRATIONS OUTSIDE THIS RANGE. ACETAMINOPHEN CONCENTRATIONS >150 ug/mL AT 4 HOURS AFTER INGESTION AND >50 ug/mL AT 12 HOURS AFTER INGESTION ARE OFTEN ASSOCIATED WITH TOXIC REACTIONS.   Urine rapid drug screen (hosp performed) (Not at [facility])  Status: None   Collection Time: 12/09/14  3:50 PM  Result Value Ref Range   Opiates NONE DETECTED NONE DETECTED   Cocaine NONE DETECTED NONE DETECTED   Benzodiazepines NONE DETECTED NONE DETECTED    Amphetamines NONE DETECTED NONE DETECTED   Tetrahydrocannabinol NONE DETECTED NONE DETECTED   Barbiturates NONE DETECTED NONE DETECTED    Comment:        DRUG SCREEN FOR MEDICAL PURPOSES ONLY.  IF CONFIRMATION IS NEEDED FOR ANY PURPOSE, NOTIFY LAB WITHIN 5 DAYS.        LOWEST DETECTABLE LIMITS FOR URINE DRUG SCREEN Drug Class       Cutoff (ng/mL) Amphetamine      1000 Barbiturate      200 Benzodiazepine   480 Tricyclics       165 Opiates          300 Cocaine          300 THC              50     Observation Level/Precautions:  15 minute checks  Laboratory:  CBC Chemistry Profile UDS  Psychotherapy:  Individual and Group Therapy  Medications:  Continue Zoloft at 75 mg daily for depression but consider increasing dose as tolerated, Trazodone 50 mg hs prn insomnia  Consultations:  As needed  Discharge Concerns:  Safety and Stability   Estimated LOS: 3-5 days  Other:  Increase collateral information from family    Psychological Evaluations: Yes   Treatment Plan Summary: Daily contact with patient to assess and evaluate symptoms and progress in treatment and Medication management  Medical Decision Making:  New problem, with additional work up planned, Review of Psycho-Social Stressors (1), Review or order clinical lab tests (1), Review of Medication Regimen & Side Effects (2) and Review of New Medication or Change in Dosage (2)  I certify that inpatient services furnished can reasonably be expected to improve the patient's condition.   Elmarie Shiley NP-C 7/29/20164:06 PM I personally assessed the patient, reviewed the physical exam and labs and formulated the treatment plan Geralyn Flash A. Sabra Heck, M.D.

## 2014-12-10 NOTE — Progress Notes (Signed)
Adult Psychoeducational Group Note  Date:  12/10/2014 Time:  9:58 PM  Group Topic/Focus:  Wrap-Up Group:   The focus of this group is to help patients review their daily goal of treatment and discuss progress on daily workbooks.  Participation Level:  Active  Participation Quality:  Appropriate  Affect:  Appropriate  Cognitive:  Alert and Appropriate  Insight: Appropriate  Engagement in Group:  Engaged  Modes of Intervention:  Discussion  Additional Comments:  Patient is deaf but had interpretor translate for her. Patient rated her day a 5. Says she wants to get some help and get back on the right track. Also says that sometimes she is in pain and gets frustrated.  [name] 12/10/2014, 9:58 PM

## 2014-12-10 NOTE — Progress Notes (Signed)
Recreation Therapy Notes  Date: 07.29.16 Time: 9:30 am Location: [address] Group Room  Group Topic: Stress Management  Goal Area(s) Addresses:  Patient will verbalize importance of using healthy stress management.  Patient will identify positive emotions associated with healthy stress management.   Behavioral Response: Engaged  Intervention: Stress Management  Activity :  Progressive Muscle Relaxation.  LRT introduced and educated patients on the technique of progressive muscle relaxation.  A script was used to deliver the technique to the patients.  Patients were asked to follow the script read a loud by the LRT to engage in practicing the stress management technique.  Education:  Stress Management, Discharge Planning.   Education Outcome: Acknowledges edcuation/In group clarification offered/Needs additional education  Clinical Observations/Feedback: Patient attended group.   Caroll Rancher, LRT/CTRS  Lillia Abed, Deniese Oberry A 12/10/2014 3:00 PM

## 2014-12-10 NOTE — [facility] Counselor (Signed)
Adult Comprehensive Assessment  Patient ID: [name], female   DOB: 08-11-1960, 54 y.o.   MRN: [id number]  Information Source: Information source: Patient  Current Stressors:  Educational / Learning stressors: None reported Employment / Job issues: Pt has been out of work since December and has applied for disability Family Relationships: pt reports that she feels that her family dismisses her due to her deafness Surveyor, quantity / Lack of resources (include bankruptcy): Pt reports no income currently Housing / Lack of housing: Pt currently unsure where she will be living at [place]. Physical health (include injuries & life threatening diseases): Pt reports taht she has physical limitations but did not elaborate Social relationships: Pt reports having friends but no major support system Substance abuse: Pt denies Bereavement / Loss: Pt's mother died in 07-29-22  Living/Environment/Situation:  Living Arrangements: Other relatives (Lives with brother but they are being told to move out) Living conditions (as described by patient or guardian): Currently not available to go back to  How long has patient lived in current situation?: Since Nov 2015 What is atmosphere in current home: Temporary  Family History:  Marital status: Single Does patient have children?: Yes How many children?: 1 How is patient's relationship with their children?: good relationship but at times can have conflict  Childhood History:  By whom was/is the patient raised?: Mother, Grandparents Description of patient's relationship with caregiver when they were a child: Reports a great relationship with her mother and grandmother growing up Patient's description of current relationship with people who raised him/her: both are deceased Does patient have siblings?: Yes Number of Siblings: 3 Description of patient's current relationship with siblings: Somewhat strained at this time, and reports that siblings are fighting more  since her mother passed away. Did patient suffer any verbal/emotional/physical/sexual abuse as a child?: No Did patient suffer from severe childhood neglect?: No Has patient ever been sexually abused/assaulted/raped as an adolescent or adult?: No Was the patient ever a victim of a crime or a disaster?: No Witnessed domestic violence?: No Has patient been effected by domestic violence as an adult?: No  Education:  Highest grade of school patient has completed: Some college Currently a Consulting civil engineer?: No Learning disability?: No  Employment/Work Situation:   Employment situation: Unemployed Patient's job has been impacted by current illness: No What is the longest time patient has a held a job?: 29 years Where was the patient employed at that time?: [employer] Has patient ever been in the [employer]?: No Has patient ever served in Buyer, retail?: No  Financial Resources:   Surveyor, quantity resources: No income Does patient have a Lawyer or guardian?: No (but daughter handles her finances)  Alcohol/Substance Abuse:   What has been your use of drugs/alcohol within the last 12 months?: Pt denies If attempted suicide, did drugs/alcohol play a role in this?: No Alcohol/Substance Abuse Treatment Hx: Denies past history Has alcohol/substance abuse ever caused legal problems?: No  Social Support System:   Forensic psychologist System: Poor Describe Community Support System: Denies having consistent support Type of faith/religion: Christian How does patient's faith help to cope with current illness?: is involved in church and Bible study  Leisure/Recreation:   Leisure and Hobbies: Doesn't know  Strengths/Needs:   What things does the patient do well?: did not state In what areas does patient struggle / problems for patient: did not state  Discharge Plan:   Does patient have access to transportation?: Yes Will patient be returning to same living situation after  discharge?:  No Plan for living situation after discharge: Unknown at this point Currently receiving community mental health services: Yes (From Whom) ([facility] [place]) Does patient have financial barriers related to discharge medications?: Yes Patient description of barriers related to discharge medications: no income, no insurance  Summary/Recommendations:     Patient is a 54 year old Deaf African American female with a diagnosis of MDD, recurrent, severe.  She reports that she came to the hospital after worsening depression. She reports that she is being made to move out of her mother's house currently and has no other place to go. She is currently unemployed and has applied for disability.  Pt's mother passed away in 07/29/22 and reports that his has made her depression worse. Pt is an established Pt at [employer]; has supports in the deaf community. Patient will benefit from crisis stabilization, medication evaluation, group therapy and psycho education in addition to case management for discharge planning.     Elaina Hoops. 12/10/2014

## 2014-12-10 NOTE — [facility] Suicide Risk Assessment (Signed)
[facility] Admission Suicide Risk Assessment   Nursing information obtained from:  Patient Demographic factors:  Caucasian Current Mental Status:    Loss Factors:  Loss of significant relationship, Financial problems / change in socioeconomic status Historical Factors:    Risk Reduction Factors:  Religious beliefs about death, Living with another person, especially a relative Total Time spent with patient: 45 minutes Principal Problem: Recurrent major depression-severe Diagnosis:   Patient Active Problem List   Diagnosis Date Noted  . Recurrent major depression-severe [F33.2] 12/09/2014  . Difficulty hearing [H91.90] 03/02/2014     Continued Clinical Symptoms:  Alcohol Use Disorder Identification Test Final Score (AUDIT): 0 The "Alcohol Use Disorders Identification Test", Guidelines for Use in Primary Care, Second Edition.  World Science writer [facility]). Score between 0-7:  no or low risk or alcohol related problems. Score between 8-15:  moderate risk of alcohol related problems. Score between 16-19:  high risk of alcohol related problems. Score 20 or above:  warrants further diagnostic evaluation for alcohol dependence and treatment.   CLINICAL FACTORS:   Depression:   Severe   Musculoskeletal: Strength & Muscle Tone: within normal limits Gait & Station: normal Patient leans: normal  Psychiatric Specialty Exam: Physical Exam  Review of Systems  Constitutional: Positive for malaise/fatigue.  HENT:       Deaf  Eyes: Negative.   Respiratory: Negative.   Cardiovascular: Negative.   Gastrointestinal: Positive for nausea.  Genitourinary: Negative.   Musculoskeletal: Negative.   Skin: Negative.   Neurological: Positive for weakness.  Endo/Heme/Allergies: Negative.   Psychiatric/Behavioral: Positive for depression and suicidal ideas. The patient is nervous/anxious and has insomnia.     Blood pressure 135/99, pulse 100, temperature 97.4 F (36.3 C), temperature source Oral,  resp. rate 16, height  (1.651 m), weight 146.965 kg (324 lb), SpO2 99 %.Body mass index is 53.92 kg/(m^2).  General Appearance: Fairly Groomed  Patent attorney::  Fair  Speech:  deaf, signs   Volume:  deaf, signs  Mood:  Depressed  Affect:  Depressed  Thought Process:  Coherent and Goal Directed  Orientation:  Full (Time, Place, and Person)  Thought Content:  symptoms events worries concerns  Suicidal Thoughts:  not right now  Homicidal Thoughts:  No  Memory:  Immediate;   Fair Recent;   Fair Remote;   Fair  Judgement:  Fair  Insight:  Present  Psychomotor Activity:  Restlessness  Concentration:  Fair  Recall:  [employer] of Knowledge:Fair  Language: sign language  Akathisia:  No  Handed:  Right  AIMS (if indicated):     Assets:  Desire for Improvement  Sleep:  Number of Hours: 5  Cognition: WNL  ADL's:  Intact     COGNITIVE FEATURES THAT CONTRIBUTE TO RISK:  None    SUICIDE RISK:   Moderate:  Frequent suicidal ideation with limited intensity, and duration, some specificity in terms of plans, no associated intent, good self-control, limited dysphoria/symptomatology, some risk factors present, and identifiable protective factors, including available and accessible social support. 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 She started seeing a therapist in February and was placed on medications in May ( Zoloft 50 mg ) . In November she started feeling depressed, isolating. In December she lost her job  with the [employer] after 29 years as she was not able to function. In 07-18-22 her mother died and she still feels guilty although rationally she knows it was not her fault. Their  dog died 3 weeks before her mother. She was living with her mother and her brother and now she found out they have to leave the house and she does not know what is going to happen, where is she going to go She states that her family does not sign and they do not communicate  with her. She is also losing her therapist  PLAN OF CARE: Supportive approach/coping skills                              Depression; will increase the Zoloft to 75 mg daily                              Will work with grief and loss                              Will try to clarify with the family what is going on                              Will ask the chaplain to meet with her to have some individual grief counseling          CBT/mindfulness  Medical Decision Making:  Review of Psycho-Social Stressors (1), Review or order clinical lab tests (1), Review of Medication Regimen & Side Effects (2) and Review of New Medication or Change in Dosage (2)  I certify that inpatient services furnished can reasonably be expected to improve the patient's condition.   Marlow Berenguer A 12/10/2014, 8:14 PM

## 2014-12-11 DIAGNOSIS — F332 Major depressive disorder, recurrent severe without psychotic features: Principal | ICD-10-CM

## 2014-12-11 MED ORDER — METOPROLOL TARTRATE 25 MG PO TABS
12.5000 mg | ORAL_TABLET | Freq: Two times a day (BID) | ORAL | Status: DC
  Administered 2014-12-12 – 2014-12-16 (×9): 12.5 mg via ORAL
  Filled 2014-12-11 (×3): qty 0.5
  Filled 2014-12-11: qty 1
  Filled 2014-12-11 (×4): qty 0.5
  Filled 2014-12-11: qty 1
  Filled 2014-12-11: qty 0.5
  Filled 2014-12-11: qty 1
  Filled 2014-12-11: qty 0.5
  Filled 2014-12-11: qty 3
  Filled 2014-12-11: qty 0.5
  Filled 2014-12-11: qty 3

## 2014-12-11 MED ORDER — SERTRALINE HCL 100 MG PO TABS
100.0000 mg | ORAL_TABLET | Freq: Every day | ORAL | Status: DC
  Administered 2014-12-12 – 2014-12-16 (×5): 100 mg via ORAL
  Filled 2014-12-11 (×5): qty 1
  Filled 2014-12-11: qty 3
  Filled 2014-12-11 (×2): qty 1

## 2014-12-11 NOTE — Progress Notes (Signed)
Writer spoke with patient and she reports that her day has been okay. She attended group this evening and participated with her interpreter present. She c/o right knee pain and an order received for motrin which she reported that she will take with her sleep medication closer to 2300. She has been up in the dayroom this evening. She denies si/hi/a/v hallucinations. Support and encouragement given, safety maintained on unit with 15 min checks.

## 2014-12-11 NOTE — Plan of Care (Signed)
Problem: Diagnosis: Increased Risk For Suicide Attempt Goal: STG-Patient Will Comply With Medication Regime Outcome: Progressing Patient is compliant with scheduled medication.     

## 2014-12-11 NOTE — [facility] Group Notes (Signed)
[facility] LCSW Group Therapy  12/11/2014 1:15 PM  Type of Therapy:  Group Therapy  Participation Level:  Active  Participation Quality:  Attentive and Sharing  Affect:  Appropriate  Cognitive:  Appropriate  Insight:  Developing/Improving  Engagement in Therapy:  Developing/Improving  Modes of Intervention:  Discussion, Exploration, Rapport Building, Socialization and Support  Summary of Progress/Problems: The main focus of today's process group was to learn how to use a decisional balance exercise to move forward in the Stages of Change, which were described and discussed. Motivational Interviewing and the whiteboard were utilized to help patients explore in depth the perceived benefits and costs of unhealthy coping techniques, as well as the benefits and costs of replacing that with a healthy coping skills.[name] who was present with sign language professional left dayroom at one point yet did return. Patient shared little unless asked direct questions. [name] reported motivation to decrease her isolation yet was unable to share how she could decrease that other than returning texts from friends and family and "sometimes I don't have the energy to do that." Patient also alluded that "others don't have time for me" yet then shared concerned friends sent [employer] to check on her.  Clide Dales 12/11/2014 3:11 PM

## 2014-12-11 NOTE — Progress Notes (Deleted)
D: Pt alert and oriented x4. Pt at this time still endorses severe depression; Pt however verbalized hopefulness-he states, "I came in today very depressed; I wasn't eating; but I eat all my dinner tonight; I am starting to feel better already." Pt denies SI/HI/AVH. Pt continue to be pleasant and cooperative.  A: Pt attended group. Medications administered as prescribed.  Support, encouragement, and safe environment provided.  15-minute safety checks continue. R: Pt was med compliant.  Safety checks continue.

## 2014-12-11 NOTE — Progress Notes (Signed)
Adult Psychoeducational Group Note  Date:  12/11/2014 Time:  11:25 AM  Group Topic/Focus:  Making Healthy Choices:   The focus of this group is to help patients identify negative/unhealthy choices they were using prior to admission and identify positive/healthier coping strategies to replace them upon discharge.  Participation Level:  Active  Participation Quality:  Appropriate  Affect:  Appropriate  Cognitive:  Alert, Appropriate and Oriented  Insight: Appropriate and Improving  Engagement in Group:  Improving  Modes of Intervention:  Discussion and Education  Additional Comments:  Pt was able to share in group in a positive way has difficulty with knee pain  Jimmey Ralph 12/11/2014, 11:25 AM

## 2014-12-11 NOTE — Plan of Care (Signed)
Problem: Diagnosis: Increased Risk For Suicide Attempt Goal: STG-Patient Will Attend All Groups On The Unit Outcome: Progressing Patient attended wrap up group this evening and participated by communicating with her interpreter

## 2014-12-11 NOTE — Progress Notes (Signed)
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 Communicating with patient by writing, she c/o R. knee pain of 5 on a 0-10 pain scale. She wrote, "I would like some medicine for my right knee pain; pain is at a 5 at this time." Pt c/o mild depression; however, denies anxiety. Pt also denies SI/HI/AVH. Pt continues to be pleasant and cooperative. A: Pt attended group. Medications administered as prescribed.  Support, encouragement, and safe environment provided.  15-minute safety checks continue. R: Pt was med compliant.  Safety checks continue

## 2014-12-11 NOTE — Progress Notes (Signed)
[facility] MD Progress Note  12/11/2014 4:17 PM [name]  MRN:  [id number] Subjective:   Patient states "I am still about the same with my depression. Not much has changed. I would rate at a six today. I am still worried about my family situation. I need something to help with that. I don't know when things will get better."   Objective:  Patient is seen and chart is reviewed. She assessed with help of sign language interpreter. Patient continues to reports symptoms of depression. Denies suicidal ideation. Reports that she is able to communicate with staff through writing when the interpreter is not available. Patient was not aware that her vistaril must be requested so her medication schedule was explained to her. She is hopeful that staff can help her explore her family situation prior to discharge. Patient worries about her lack of ability to communicate with them and housing has become a concern since her mother's death a few months ago. She is active on the unit and is attending groups.    Principal Problem: Recurrent major depression-severe Diagnosis:   Patient Active Problem List   Diagnosis Date Noted  . Recurrent major depression-severe [F33.2] 12/09/2014  . Difficulty hearing [H91.90] 03/02/2014   Total Time spent with patient: 30 minutes  Past Medical History:  Past Medical History  Diagnosis Date  . Deaf   . Chronic pain of right knee   . Anxiety   . Depression     Past Surgical History  Procedure Laterality Date  . Wisdom tooth extraction    . Abdominal hysterectomy    . Cholecystectomy     Family History: History reviewed. No pertinent family history. Social History:  History  Alcohol Use: Not on file     History  Drug Use Not on file    History   Social History  . Marital Status: Single    Spouse Name: N/A  . Number of Children: N/A  . Years of Education: N/A   Social History Main Topics  . Smoking status: Never Smoker   . Smokeless tobacco: Not on  file  . Alcohol Use: Not on file  . Drug Use: Not on file  . Sexual Activity: Not on file   Other Topics Concern  . None   Social History Narrative   Additional History:    Sleep: Fair  Appetite:  Fair   Assessment:   Musculoskeletal: Strength & Muscle Tone: within normal limits Gait & Station: normal Patient leans: N/A   Psychiatric Specialty Exam: Physical Exam  Review of Systems  Constitutional: Negative.   HENT: Negative.   Eyes: Negative.   Respiratory: Negative.   Cardiovascular: Negative.   Gastrointestinal: Negative.   Genitourinary: Negative.   Musculoskeletal: Positive for joint pain (Reports chronic knee pain that is relieved by motrin. ).  Skin: Negative.   Neurological: Negative.   Endo/Heme/Allergies: Negative.   Psychiatric/Behavioral: Positive for depression. Negative for suicidal ideas, hallucinations and substance abuse. The patient is nervous/anxious. The patient does not have insomnia.     Blood pressure 142/84, pulse 92, temperature 97.3 F (36.3 C), temperature source Oral, resp. rate 16, height  (1.651 m), weight 146.965 kg (324 lb), SpO2 99 %.Body mass index is 53.92 kg/(m^2).  General Appearance: Casual  Eye Contact::  Good  Speech:  Unable to assess due to being deaf   Volume:  N/A  Mood:  Anxious  Affect:  Constricted  Thought Process:  Goal Directed and Intact  Orientation:  Full (  Time, Place, and Person)  Thought Content:  Symptoms, worries, concerns   Suicidal Thoughts:  No  Homicidal Thoughts:  No  Memory:  Immediate;   Good Recent;   Good Remote;   Good  Judgement:  Fair  Insight:  Present  Psychomotor Activity:  Normal  Concentration:  Good  Recall:  Good  Fund of Knowledge:Good  Language: Good  Akathisia:  No  Handed:  Right  AIMS (if indicated):     Assets:  Communication Skills Desire for Improvement Leisure Time Resilience  ADL's:  Intact  Cognition: WNL  Sleep:  Number of Hours: 5    Current  Medications: Current Facility-Administered Medications  Medication Dose Route Frequency Provider Last Rate Last Dose  . acetaminophen (TYLENOL) tablet 650 mg  650 mg Oral Q6H PRN Adonis Brook, NP      . alum & mag hydroxide-simeth (MAALOX/MYLANTA) 200-200-20 MG/5ML suspension 30 mL  30 mL Oral Q4H PRN Adonis Brook, NP      . hydrOXYzine (ATARAX/VISTARIL) tablet 25 mg  25 mg Oral Q6H PRN Thermon Leyland, NP      . ibuprofen (ADVIL,MOTRIN) tablet 400 mg  400 mg Oral Q6H PRN Worthy Flank, NP   400 mg at 12/10/14 2306  . magnesium hydroxide (MILK OF MAGNESIA) suspension 30 mL  30 mL Oral Daily PRN Adonis Brook, NP      . Melene Muller ON 12/12/2014] sertraline (ZOLOFT) tablet 100 mg  100 mg Oral Daily Thermon Leyland, NP      . traZODone (DESYREL) tablet 50 mg  50 mg Oral QHS PRN Adonis Brook, NP   50 mg at 12/10/14 2306    Lab Results: No results found for this or any previous visit (from the past 48 hour(s)).  Physical Findings: AIMS:  , ,  ,  ,    CIWA:    COWS:     Treatment Plan Summary: Daily contact with patient to assess and evaluate symptoms and progress in treatment and Medication management  Continue crisis management and stabilization.  Medication management:  Increase Zoloft to 100 mg daily for depression Continue Vistaril 25 mg every six hours prn anxiety Encouraged patient to attend groups and participate in group counseling sessions and activities.  Discharge plan in progress.  Continue current treatment plan.  Address health issues: Start Lopressor 12.5 mg bid for elevated blood pressure/anxiety.  Will work with grief and loss Will try to clarify with the family what is going on  Will ask the chaplain to meet with her to have some individual grief counseling   Medical Decision Making:  Established Problem, Stable/Improving (1), Review of Psycho-Social Stressors (1), Review or order clinical lab tests (1), Review of Medication Regimen & Side Effects (2) and  Review of New Medication or Change in Dosage (2)  DAVIS, LAURA, NP-C 12/11/2014, 4:17 PM  I reviewed chart and agreed with the findings and treatment Plan.  Kathryne Sharper, MD

## 2014-12-11 NOTE — Progress Notes (Signed)
Nursing Progress Note: 7-7p  D- Mood is depressed and anxious,rates anxiety at 5/10. Pt reports having difficulty concentrating and focusing via interpreter. Pt is able to contract for safety. Continues to have difficulty staying asleep. Goal for today is to get help . Pt worries what going to happen to her due to losing her mother's house.  A - Observed pt minimally interacting in group and in the milieu via interpreter.Support and encouragement offered, safety maintained with q 15 minutes. Group discussion included healthy coping skills. Pt did say her daughter has 2 little babies and she's close with her but she can't stay with her. spiritual Consult ordered  R-Contracts for safety and continues to follow treatment plan, working on learning new coping skills.

## 2014-12-12 DIAGNOSIS — F332 Major depressive disorder, recurrent severe without psychotic features: Secondary | ICD-10-CM | POA: Insufficient documentation

## 2014-12-12 NOTE — Progress Notes (Signed)
Patient ID: [name], female   DOB: [birth year], 54 y.o.   MRN: [id number] [facility] MD Progress Note  12/12/2014 3:44 PM April OCONNOR  MRN:  [id number] Subjective:   Patient states "I slept good. I feel a little more sleepy today. I did not realize that it could be from medication changes. I am not as anxious. I have not felt the need to ask for a prn dose of vistaril."   Objective:  Patient is seen and chart is reviewed. She assessed with help of sign language interpreter. Patient continues to reports symptoms of depression but less anxiety. Denies suicidal ideation. Reports that she is able to communicate with staff through writing when the interpreter is not available. At times she does worry about not being able to hear what is going on around her on the unit but reported "Maybe that is better anyway." She is hopeful that staff can help her explore her family situation prior to discharge. Patient worries about her lack of ability to communicate with them and housing has become a concern since her mother's death a few months ago. She is active on the unit and is attending groups. Her Zoloft was increased to 100 mg daily yesterday. The medication Lopressor was started to address blood pressure. It was explained that these changes could cause some temporary somnolence. She also appeared to be more expressive today talking about her experience in life being hearing impaired.   Principal Problem: Recurrent major depression-severe Diagnosis:   Patient Active Problem List   Diagnosis Date Noted  . Recurrent major depression-severe [F33.2] 12/09/2014  . Difficulty hearing [H91.90] 03/02/2014   Total Time spent with patient: 20 minutes  Past Medical History:  Past Medical History  Diagnosis Date  . Deaf   . Chronic pain of right knee   . Anxiety   . Depression     Past Surgical History  Procedure Laterality Date  . Wisdom tooth extraction    . Abdominal hysterectomy    .  Cholecystectomy     Family History: History reviewed. No pertinent family history. Social History:  History  Alcohol Use: Not on file     History  Drug Use Not on file    History   Social History  . Marital Status: Single    Spouse Name: N/A  . Number of Children: N/A  . Years of Education: N/A   Social History Main Topics  . Smoking status: Never Smoker   . Smokeless tobacco: Not on file  . Alcohol Use: Not on file  . Drug Use: Not on file  . Sexual Activity: Not on file   Other Topics Concern  . None   Social History Narrative   Additional History:    Sleep: Fair  Appetite:  Fair   Assessment:   Musculoskeletal: Strength & Muscle Tone: within normal limits Gait & Station: normal Patient leans: N/A   Psychiatric Specialty Exam: Physical Exam  Review of Systems  Constitutional: Negative.   HENT: Negative.   Eyes: Negative.   Respiratory: Negative.   Cardiovascular: Negative.   Gastrointestinal: Negative.   Genitourinary: Negative.   Musculoskeletal: Positive for joint pain (Reports chronic knee pain that is relieved by motrin. ).  Skin: Negative.   Neurological: Negative.   Endo/Heme/Allergies: Negative.   Psychiatric/Behavioral: Positive for depression. Negative for suicidal ideas, hallucinations, memory loss and substance abuse. The patient is nervous/anxious. The patient does not have insomnia.     Blood pressure 143/90, pulse 87, temperature  98.6 F (37 C), temperature source Oral, resp. rate 18, height 5\' 5"  (1.651 m), weight 146.965 kg (324 lb), SpO2 99 %.Body mass index is 53.92 kg/(m^2).  General Appearance: Casual  Eye Contact::  Good  Speech:  Unable to assess due to being deaf   Volume:  N/A  Mood:  Anxious  Affect:  Constricted  Thought Process:  Goal Directed and Intact  Orientation:  Full (Time, Place, and Person)  Thought Content:  Symptoms, worries, concerns   Suicidal Thoughts:  No  Homicidal Thoughts:  No  Memory:  Immediate;    Good Recent;   Good Remote;   Good  Judgement:  Fair  Insight:  Present  Psychomotor Activity:  Normal  Concentration:  Good  Recall:  Good  Fund of Knowledge:Good  Language: Good  Akathisia:  No  Handed:  Right  AIMS (if indicated):     Assets:  Communication Skills Desire for Improvement Leisure Time Resilience  ADL's:  Intact  Cognition: WNL  Sleep:  Number of Hours: 5    Current Medications: Current Facility-Administered Medications  Medication Dose Route Frequency Provider Last Rate Last Dose  . acetaminophen (TYLENOL) tablet 650 mg  650 mg Oral Q6H PRN Adonis Brook, NP      . alum & mag hydroxide-simeth (MAALOX/MYLANTA) 200-200-20 MG/5ML suspension 30 mL  30 mL Oral Q4H PRN Adonis Brook, NP      . hydrOXYzine (ATARAX/VISTARIL) tablet 25 mg  25 mg Oral Q6H PRN Thermon Leyland, NP      . ibuprofen (ADVIL,MOTRIN) tablet 400 mg  400 mg Oral Q6H PRN Worthy Flank, NP   400 mg at 12/11/14 2114  . magnesium hydroxide (MILK OF MAGNESIA) suspension 30 mL  30 mL Oral Daily PRN Adonis Brook, NP      . metoprolol tartrate (LOPRESSOR) tablet 12.5 mg  12.5 mg Oral BID Thermon Leyland, NP   12.5 mg at 12/12/14 0818  . sertraline (ZOLOFT) tablet 100 mg  100 mg Oral Daily Thermon Leyland, NP   100 mg at 12/12/14 0817  . traZODone (DESYREL) tablet 50 mg  50 mg Oral QHS PRN Adonis Brook, NP   50 mg at 12/11/14 2305    Lab Results: No results found for this or any previous visit (from the past 48 hour(s)).  Physical Findings: AIMS: Facial and Oral Movements Muscles of Facial Expression: None, normal Lips and Perioral Area: None, normal Jaw: None, normal Tongue: None, normal,Extremity Movements Upper (arms, wrists, hands, fingers): None, normal Lower (legs, knees, ankles, toes): None, normal, Trunk Movements Neck, shoulders, hips: None, normal, Overall Severity Severity of abnormal movements (highest score from questions above): None, normal Incapacitation due to abnormal  movements: None, normal Patient's awareness of abnormal movements (rate only patient's report): No Awareness, Dental Status Current problems with teeth and/or dentures?: No Does patient usually wear dentures?: No  CIWA:    COWS:     Treatment Plan Summary: Daily contact with patient to assess and evaluate symptoms and progress in treatment and Medication management  Continue crisis management and stabilization.  Medication management:  Continue Zoloft to 100 mg daily for depression Continue Vistaril 25 mg every six hours prn anxiety Continue motrin 400 mg every six hours prn chronic knee pain.  Encouraged patient to attend groups and participate in group counseling sessions and activities.  Discharge plan in progress.  Continue current treatment plan.  Address health issues: Continue Lopressor 12.5 mg bid for elevated blood pressure/anxiety.  Will work  with grief and loss Will try to clarify with the family what is going on  Will ask the chaplain to meet with her to have some individual grief counseling   Medical Decision Making:  Established Problem, Stable/Improving (1), Review of Psycho-Social Stressors (1), Review or order clinical lab tests (1), Review of Medication Regimen & Side Effects (2) and Review of New Medication or Change in Dosage (2)  DAVIS, LAURA, NP-C 12/12/2014, 3:44 PM  I reviewed chart and agreed with the findings and treatment Plan.  Kathryne Sharper, MD

## 2014-12-12 NOTE — Progress Notes (Signed)
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 Communicating with patient through an interpreter, Pt continues to endorse moderate depression of 5 on a 0-10 depression scale. Pt also state one of the reasons she is still this depressed could be because there is no one for her to talk to. She feels that the facility needs to be deaf and mute friendly. Pt also continues to c/o R. knee pain of 5 on a 0-10 pain scale. Pt however denies SI/HI/AVH. Pt continues to be pleasant and cooperative.  A: Pt attended group accompanied by her interpreter. Medications administered as prescribed.  Support, encouragement, and safe environment provided.  15-minute safety checks continue. R: Pt was med compliant.  Safety checks continue

## 2014-12-12 NOTE — Progress Notes (Signed)
D- Patient is in a depressed mood with a flat affect.  She denies SI, HI, and AVH.  Patient has c/o right knee pain. Patient is deaf and mute. Communication was conducted via a Management consultant) or written notes back and forth.  Patient states that she is ready to go home but when asked if she thinks that she'll be safe at home, patient mouthed "I don't know".  Patient found out, via phone from daughter, that the daughter has arranged for patient to stay with a friend for a month following discharge.  Patient was really excited to hear this news.  Per patient, there is some family conflict amongst patient and her siblings since her mother's passing.  Patient was suppose to get the mother's house but her sister got power of attorney at the last minute and told patient that she had to be out of the house by December 12, 2014.  Patient would like, if at all possible, a family meeting here at [facility] with her family, a Child psychotherapist, and an interpreter in order to sort out a permanent living arrangement.  Patient's family does not sign and it is difficult for patient to voice her concerns without the support of an interpreter and in a safe environment like [facility].   Patient is also requesting resources for grief counseling and states that she is still mourning her mother's death.  On patient self-inventory, she rates her depression and feelings of hopelessness both a "5/10" and anxiety a "4/10" with 10 being the worst.     A- Scheduled medications administered to patient, per MD orders. Support and encouragement provided.  Routine safety checks conducted every 15 minutes.  Patient informed to notify staff with problems or concerns. R- No adverse drug reactions noted. Patient contracts for safety at this time. Patient compliant with medications and treatment plan. Patient receptive, calm, and cooperative. Patient remains safe at this time.

## 2014-12-12 NOTE — [facility] Group Notes (Signed)
[facility] Group Notes:  (Nursing/MHT/Case Management/Adjunct)  Date:  12/12/2014  Time:  12:02 AM  Type of Therapy:  Psychoeducational Skills  Participation Level:  Active  Participation Quality:  Appropriate and Sharing      Affect:  Appropriate  Cognitive:  Alert and Oriented  Insight:  Good  Engagement in Group:  Engaged  Modes of Intervention:  Discussion and Support  Summary of Progress/Problems: Pt. Rates her day a 6.  States she is learning to focus more on self and staying positive.  Cooper Render 12/12/2014, 12:02 AM

## 2014-12-12 NOTE — [facility] Group Notes (Signed)
[facility] Group Notes:  (Clinical Social Work)  12/12/2014  1:15-2:15PM  Summary of Progress/Problems:   The main focus of today's process group was to   1)  discuss the importance of adding supports  2)  define health supports versus unhealthy supports  3)  identify the patient's current unhealthy supports and plan how to handle them  4)  Identify the patient's current healthy supports and plan what to add.  An emphasis was placed on using counselor, doctor, therapy groups, 12-step groups, and problem-specific support groups to expand supports.    The patient expressed full comprehension of the concepts presented, and agreed that there is a need to add more supports.  The patient stated she would like to add a hobby, but does not have any and has not done anything except work for so long, she does not know what to do.  We went around the room and every patient and CSW shared 2-3 hobbies they engage in and gave ideas about where to find out more information if she expressed interest.  She was particularly interested in looking at free [employer] classes.  She appeared to feel very supported at [facility] with her therapist, as well as with the friends who can communicate with her and thus understand her.  Type of Therapy:  Process Group with Motivational Interviewing  Participation Level:  Active  Participation Quality:  Appropriate, Attentive, Sharing and Supportive  Affect:  Blunted  Cognitive:  Alert, Appropriate and Oriented  Insight:  Engaged  Engagement in Therapy:  Engaged  Modes of Intervention:   Education, Support and Processing, Activity  Ambrose Mantle, LCSW 12/12/2014

## 2014-12-12 NOTE — [facility] Group Notes (Signed)
[facility] Group Notes:  (Nursing/MHT/Case Management/Adjunct)  Date:  12/12/2014  Time:  0930  Type of Therapy:  Nurse Education  Participation Level:  Active  Participation Quality:  Appropriate and Attentive  Affect:  Appropriate  Cognitive:  Alert and Appropriate  Insight:  Appropriate  Engagement in Group:  Engaged  Modes of Intervention:  Education  Summary of Progress/Problems:Patient attended group and was actively engaged. She rates her depression  5/10, feelings of hopelessness 5/10 and anxiety 4/10 with 10 being the worst.  Patient reports she would like to "ask nurse when I need help" and "be prepared to take care of myself when I go home".  Larry Sierras P 12/12/2014, 10:40 AM

## 2014-12-13 MED ORDER — IBUPROFEN 600 MG PO TABS
600.0000 mg | ORAL_TABLET | Freq: Four times a day (QID) | ORAL | Status: DC | PRN
  Administered 2014-12-13 – 2014-12-15 (×3): 600 mg via ORAL
  Filled 2014-12-13 (×3): qty 1

## 2014-12-13 NOTE — Progress Notes (Signed)
Recreation Therapy Notes  Date: 08.01.16 Time: 9:30 am Location: [address] Group Room  Group Topic: Stress Management  Goal Area(s) Addresses:  Patient will verbalize importance of using healthy stress management.  Patient will identify positive emotions associated with healthy stress management.   Intervention: Stress Management  Activity :  Guided Training and development officer.  LRT introduced and educated patients on the stress management technique of guided imagery script.  A script was read and patients were asked to follow a long as the LRT read the script a loud to engaged in the technique of guided imagery.  Education:  Stress Management, Discharge Planning.   Education Outcome: Acknowledges edcuation/In group clarification offered/Needs additional education  Clinical Observations/Feedback: Patient did not attend group.   Caroll Rancher, LRT/CTRS         Lillia Abed, Darionna Banke A 12/13/2014 1:23 PM

## 2014-12-13 NOTE — Progress Notes (Signed)
Patient ID: [name], female   DOB: [birth year], 54 y.o.   MRN: [id number] [facility] MD Progress Note  12/13/2014 1:51 PM MARK BENECKE  MRN:  [id number] Subjective:   Patient states "I slept good. My knee pain is bothering me some. I do not feel as sleepy today. I am adjusting to the medication changes. A Child psychotherapist from [facility] is coming to meet with me today. I still am worried about where I will live after discharge."   Objective:  Patient is seen and chart is reviewed. She is assessed with help of sign language interpreter. Patient continues to reports symptoms of depression but less anxiety. Rates at a five today.  Denies suicidal ideation. Reports that she is able to communicate with staff through writing when the interpreter is not available. She is hopeful that staff can help her explore her family situation prior to discharge. Patient worries about her lack of ability to communicate with them and housing has become a concern since her mother's death a few months ago. The social worker reported in treatment team that her daughter has found her a place to stay for the next month. She is active on the unit and is attending groups. She is tolerating the increase in he Zoloft with no reported side effects of nausea. Per Child psychotherapist who has talked to family the patient had history of severe depressive episodes where she does not answer the phone and has severe impairment in functioning.   Principal Problem: Recurrent major depression-severe Diagnosis:   Patient Active Problem List   Diagnosis Date Noted  . 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 Recurrent major depression-severe [F33.2] 12/09/2014  . Difficulty hearing [H91.90] 03/02/2014   Total Time spent with patient: 20 minutes  Past Medical History:  Past Medical History  Diagnosis Date  . Deaf   . Chronic pain of right knee   . Anxiety   . Depression     Past Surgical History   Procedure Laterality Date  . Wisdom tooth extraction    . Abdominal hysterectomy    . Cholecystectomy     Family History: History reviewed. No pertinent family history. Social History:  History  Alcohol Use: Not on file     History  Drug Use Not on file    History   Social History  . Marital Status: Single    Spouse Name: N/A  . Number of Children: N/A  . Years of Education: N/A   Social History Main Topics  . Smoking status: Never Smoker   . Smokeless tobacco: Not on file  . Alcohol Use: Not on file  . Drug Use: Not on file  . Sexual Activity: Not on file   Other Topics Concern  . None   Social History Narrative   Additional History:    Sleep: Fair  Appetite:  Fair  Musculoskeletal: Strength & Muscle Tone: within normal limits Gait & Station: normal Patient leans: N/A  Psychiatric Specialty Exam: Physical Exam  Review of Systems  Constitutional: Negative.   HENT: Negative.   Eyes: Negative.   Respiratory: Negative.   Cardiovascular: Negative.   Gastrointestinal: Negative.   Genitourinary: Negative.   Musculoskeletal: Positive for joint pain (Reports chronic knee pain that is relieved by motrin. ).  Skin: Negative.   Neurological: Negative.   Endo/Heme/Allergies: Negative.   Psychiatric/Behavioral: Positive for depression. Negative for suicidal ideas, hallucinations, memory loss and substance abuse. The patient is nervous/anxious. The patient does  not have insomnia.     Blood pressure 139/81, pulse 82, temperature 97.9 F (36.6 C), temperature source Oral, resp. rate 18, height 5\' 5"  (1.651 m), weight 146.965 kg (324 lb), SpO2 99 %.Body mass index is 53.92 kg/(m^2).  General Appearance: Casual  Eye Contact::  Good  Speech:  Unable to assess due to being deaf   Volume:  N/A  Mood:  Anxious  Affect:  Constricted  Thought Process:  Goal Directed and Intact  Orientation:  Full (Time, Place, and Person)  Thought Content:  Symptoms, worries, concerns    Suicidal Thoughts:  No  Homicidal Thoughts:  No  Memory:  Immediate;   Good Recent;   Good Remote;   Good  Judgement:  Fair  Insight:  Present  Psychomotor Activity:  Normal  Concentration:  Good  Recall:  Good  Fund of Knowledge:Good  Language: Good  Akathisia:  No  Handed:  Right  AIMS (if indicated):     Assets:  Communication Skills Desire for Improvement Leisure Time Resilience  ADL's:  Intact  Cognition: WNL  Sleep:  Number of Hours: 5    Current Medications: Current Facility-Administered Medications  Medication Dose Route Frequency Provider Last Rate Last Dose  . acetaminophen (TYLENOL) tablet 650 mg  650 mg Oral Q6H PRN Adonis Brook, NP      . alum & mag hydroxide-simeth (MAALOX/MYLANTA) 200-200-20 MG/5ML suspension 30 mL  30 mL Oral Q4H PRN Adonis Brook, NP      . hydrOXYzine (ATARAX/VISTARIL) tablet 25 mg  25 mg Oral Q6H PRN Thermon Leyland, NP   25 mg at 12/12/14 2225  . ibuprofen (ADVIL,MOTRIN) tablet 600 mg  600 mg Oral Q6H PRN Thermon Leyland, NP      . magnesium hydroxide (MILK OF MAGNESIA) suspension 30 mL  30 mL Oral Daily PRN Adonis Brook, NP      . metoprolol tartrate (LOPRESSOR) tablet 12.5 mg  12.5 mg Oral BID Thermon Leyland, NP   12.5 mg at 12/13/14 0755  . sertraline (ZOLOFT) tablet 100 mg  100 mg Oral Daily Thermon Leyland, NP   100 mg at 12/13/14 0754  . traZODone (DESYREL) tablet 50 mg  50 mg Oral QHS PRN Adonis Brook, NP   50 mg at 12/12/14 2226    Lab Results: No results found for this or any previous visit (from the past 48 hour(s)).  Physical Findings: AIMS: Facial and Oral Movements Muscles of Facial Expression: None, normal Lips and Perioral Area: None, normal Jaw: None, normal Tongue: None, normal,Extremity Movements Upper (arms, wrists, hands, fingers): None, normal Lower (legs, knees, ankles, toes): None, normal, Trunk Movements Neck, shoulders, hips: None, normal, Overall Severity Severity of abnormal movements (highest score from  questions above): None, normal Incapacitation due to abnormal movements: None, normal Patient's awareness of abnormal movements (rate only patient's report): No Awareness, Dental Status Current problems with teeth and/or dentures?: No Does patient usually wear dentures?: No  CIWA:    COWS:     Treatment Plan Summary: Daily contact with patient to assess and evaluate symptoms and progress in treatment and Medication management  Continue crisis management and stabilization.  Medication management:  Continue Zoloft to 100 mg daily for depression Continue Trazodone to 50 mg hs prn for insomnia  Continue Vistaril 25 mg every six hours prn anxiety Increase motrin to 600 mg every six hours prn chronic knee pain.  Encouraged patient to attend groups and participate in group counseling sessions and activities.  Discharge  plan in progress.  Continue current treatment plan.  Address health issues: Continue Lopressor 12.5 mg bid for elevated blood pressure/anxiety.  Will work with grief and loss  Will ask the chaplain to meet with her to have some individual grief counseling   Medical Decision Making:  Established Problem, Stable/Improving (1), Review of Psycho-Social Stressors (1), Review or order clinical lab tests (1), Review of Medication Regimen & Side Effects (2) and Review of New Medication or Change in Dosage (2)  DAVIS, LAURA, NP-C 12/13/2014, 1:51 PM I agree with assessment and plan Madie Reno A. Dub Mikes, M.D.

## 2014-12-13 NOTE — Progress Notes (Signed)
D:  Pt presents with flat affect and depressed mood. Pt cautious during shift assessment. Pt rates depression 5/10. Anxiety 3/10. Sleep "good".  Pt denies suicidal thoughts. Pt verbalized symptoms with writer this morning using pencil and paper. No complaints verbalized by pt. Pt stated that she is tolerating her meds well. Pt compliant with taking meds and attending groups. Writer spoke with Annice Pih via telephone & translator, pt therapist on her behalf. Pt agreed to have therapist visit today at 1430 for a session with Lauren, CSW. Lauren, CSW made aware and agreed to meeting. A: Medications administered as ordered per MD. Verbal support given. Pt encouraged to attend groups. 15 minute checks performed for safety.  R: Pt receptive to treatment.

## 2014-12-13 NOTE — [facility] Group Notes (Signed)
[facility] LCSW Group Therapy  12/13/2014 1:15pm  Type of Therapy:  Group Therapy vercoming Obstacles  Participation Level:  Minimal  Participation Quality:  Reserved  Affect:  Appropriate  Cognitive:  Appropriate and Oriented  Insight:  Developing/Improving and Improving  Engagement in Therapy:  Improving  Modes of Intervention:  Discussion, Exploration, Problem-solving and Support  Description of Group:   In this group patients will be encouraged to explore what they see as obstacles to their own wellness and recovery. They will be guided to discuss their thoughts, feelings, and behaviors related to these obstacles. The group will process together ways to cope with barriers, with attention given to specific choices patients can make. Each patient will be challenged to identify changes they are motivated to make in order to overcome their obstacles. This group will be process-oriented, with patients participating in exploration of their own experiences as well as giving and receiving support and challenge from other group members.  Summary of Patient Progress: Pt did not participate verbally in group discussion but remained engaged.   Therapeutic Modalities:   Cognitive Behavioral Therapy Solution Focused Therapy Motivational Interviewing Relapse Prevention Therapy   Chad Cordial, LCSWA 12/13/2014 4:15 PM

## 2014-12-13 NOTE — Progress Notes (Signed)
[facility] Group Notes:  (Nursing/MHT/Case Management/Adjunct)  Date:  12/13/2014  Time:  11:00 PM  Type of Therapy:  Psychoeducational Skills  Participation Level:  Active  Participation Quality:  Appropriate  Affect:  Appropriate  Cognitive:  Appropriate  Insight:  Good  Engagement in Group:  Engaged  Modes of Intervention:  Discussion  Summary of Progress/Problems: Tonight in wrap up group Janyce said that her day was a 5 she was feeling some anxiety today but her daughter came to visit her and another visitor made her day. Madaline Savage 12/13/2014, 11:00 PM

## 2014-12-13 NOTE — [facility] Group Notes (Signed)
[facility] LCSW Aftercare Discharge Planning Group Note  12/13/2014 8:45 AM  Participation Quality: Alert, Appropriate and Oriented  Mood/Affect: Appropriate  Depression Rating: 5  Anxiety Rating: 5  Thoughts of Suicide: Pt denies SI/HI  Will you contract for safety? Yes  Current AVH: Pt denies  Plan for Discharge/Comments: Pt attended discharge planning group and actively participated in group. CSW discussed suicide prevention education with the group and encouraged them to discuss discharge planning and any relevant barriers. Pt reports improving mood and expresses that her daughter was able to find a place for her to stay for a month. Pt identified no other needs at this time.  Transportation Means: Pt reports access to transportation  Supports: No supports mentioned at this time  Chad Cordial, LCSWA 12/13/2014 9:44 AM

## 2014-12-13 NOTE — Progress Notes (Signed)
Patient ID: [name], female   DOB: [birth year], 54 y.o.   MRN: [id number]  D: Met with patient and interpretor. She was smiling as visitors were leaving tonight. Patient reports her depression is probably at a "5" on scale with 10 the highest. Denies any SI and contracts for safety on the unit.  A: Staff will monitor on q 15 minute checks, follow treatment plan, and give meds as ordered. R: Cooperative on the unit. Went to group with interpretor.

## 2014-12-14 NOTE — Progress Notes (Signed)
Adult Psychoeducational Group Note  Date:  12/14/2014 Time:  0900  Group Topic/Focus:  Orientation:   The focus of this group is to educate the patient on the purpose and policies of crisis stabilization and provide a format to answer questions about their admission.  The group details unit policies and expectations of patients while admitted.  Participation Level:  Active  Participation Quality:  Appropriate  Affect:  Appropriate  Cognitive:  Appropriate  Insight: Appropriate  Engagement in Group:  Improving  Modes of Intervention:  Education  Additional Comments:  Interpreter present to translate.  Tasnia Spegal L 12/14/2014, 11:27 AM

## 2014-12-14 NOTE — Tx Team (Addendum)
Interdisciplinary Treatment Plan Update (Adult) Date: 12/14/2014   Time Reviewed: 9:30 AM  Progress in Treatment: Attending groups: Yes Participating in groups: Yes Taking medication as prescribed: Yes Tolerating medication: Yes Family/Significant other contact made: Yes, CSW has spoken with patient's daughter Patient understands diagnosis: Yes Discussing patient identified problems/goals with staff: Yes Medical problems stabilized or resolved: Yes Denies suicidal/homicidal ideation: Yes Issues/concerns per patient self-inventory: Yes Other:  New problem(s) identified: N/A  Discharge Plan or Barriers:   12/14/2014: Patient plans to stay with a friend at discharge and follow up with [facility] in [facility].  Reason for Continuation of Hospitalization:  Depression Anxiety Medication Stabilization   Comments: N/A  Estimated length of stay: 1-2  days   Patient is a 54 year old female admitted for increasing depression.  Patient will benefit from crisis stabilization, medication evaluation, group therapy, and psycho education in addition to case management for discharge planning. Patient and CSW reviewed pt's identified goals and treatment plan. Pt verbalized understanding and agreed to treatment plan.     Review of initial/current patient goals per problem list:  1. Goal(s): Patient will participate in aftercare plan   Met: Yes   Target date: 3-5 days post admission date   As evidenced by: Patient will participate within aftercare plan AEB aftercare provider and housing plan at discharge being identified.  12/14/2014: Goal met: Patient plans to stay with a friend at discharge and follow up with outpatient services.   2. Goal (s): Patient will exhibit decreased depressive symptoms and suicidal ideations.   Met: No   Target date: 3-5 days post admission date   As evidenced by: Patient will utilize self rating of depression at 3 or below and demonstrate decreased signs  of depression or be deemed stable for discharge by MD.  12/14/2014: Goal progressing.    3. Goal(s): Patient will demonstrate decreased signs and symptoms of anxiety.   Met: No   Target date: 3-5 days post admission date   As evidenced by: Patient will utilize self rating of anxiety at 3 or below and demonstrated decreased signs of anxiety, or be deemed stable for discharge by MD  12/14/2014: Goal progressing.     Attendees: Patient:    Family:    Physician: Dr. Sabra Heck 12/14/2014 9:30 AM  Nursing:  Markham Jordan, Darrol Angel ,RN 12/14/2014 9:30 AM  Clinical Social Worker: Tilden Fossa,  [place] 12/14/2014 9:30 AM  Other: Peri Maris, LCSWA  12/14/2014 9:30 AM  Other: Lucinda Dell, Beverly Sessions Liaison 12/14/2014 9:30 AM  Other: Lars Pinks, Case Manager 12/14/2014 9:30 AM  Other: Ave Filter , NP 12/14/2014 9:30 AM  Other:    Other:      Scribe for Treatment Team:  Tilden Fossa, MSW, [place] [phone]

## 2014-12-14 NOTE — Progress Notes (Signed)
D: Pt presents with flat affect and depressed mood. Pt rated depression 5/10. Anxiety 3/10. Hopeless 3/10. Pt denied suicidal thoughts. Pt reported ongoing depression d/t no family support. Pt reported that no family has come to visit. Pt verbalized the need to be able to contact her family and friends. Interpreter services came to visit with pt this morning and offered pt the option to use two different devices. Pt declined to use devices d/t one being outdated and the other device was not equipped for her to contact other friends with hearing impaired difficulties. Writer called Marchelle Folks (daughter) at pt request, to have daughter bring in pt cell phone. Daughter agreed to bring in cell phone this evening during visitation hours. Writer informed pt that she will be allowed to use cell phone to Skype  Friends and family members in the privacy of her room. Pt agreed to terms. No adverse reaction to meds verbalized by pt. Pt compliant with med regimen. A: Medications administered as ordered per MD. Verbal support given. Pt encouraged to attend groups. 15 minute checks performed for safety.  R: Pt receptive to treatment.

## 2014-12-14 NOTE — Progress Notes (Signed)
Recreation Therapy Notes  Animal-Assisted Activity (AAA) Program Checklist/Progress Notes Patient Eligibility Criteria Checklist & Daily Group note for Rec Tx Intervention  Date: 08.02.16 Time: 02:45 pm Location: [address]  AAA/T Program Assumption of Risk Form signed by Patient/ or Parent Legal Guardian yes  Patient is free of allergies or sever asthma yes  Patient reports no fear of animals yes  Patient reports no history of cruelty to animals yes  Patient understands his/her participation is voluntary yes  Patient washes hands before animal contact yes  Patient washes hands after animal contact yes  Behavioral Response:  Engaged  Education: Charity fundraiser, Appropriate Animal Interaction   Education Outcome: Acknowledges understanding/In group clarification offered/Needs additional education.   Clinical Observations/Feedback:  Patient attended group.   Caroll Rancher, LRT/CTRS         Lillia Abed, Lolamae Voisin A 12/14/2014 4:00 PM

## 2014-12-14 NOTE — [facility] Group Notes (Signed)
[facility] LCSW Group Therapy  12/14/2014   1:15 PM   Type of Therapy:  Group Therapy  Participation Level:  Active  Participation Quality:  Attentive, Sharing and Supportive  Affect:  Depressed and Flat  Cognitive:  Alert and Oriented  Insight:  Developing/Improving and Engaged  Engagement in Therapy:  Developing/Improving and Engaged  Modes of Intervention:  Clarification, Confrontation, Discussion, Education, Exploration, Limit-setting, Orientation, Problem-solving, Rapport Building, Dance movement psychotherapist, Socialization and Support  Summary of Progress/Problems: The topic for group therapy was feelings about diagnosis.  Pt actively participated in group discussion on their past and current diagnosis and how they feel towards this.  Pt also identified how society and family members judge them, based on their diagnosis as well as stereotypes and stigmas.  Patient discussed her tendency to isolate herself from her family. CSW and other group members provided emotional support and encouragement.  Samuella Bruin, MSW, [employer] Clinical Social Worker [facility] [phone]

## 2014-12-14 NOTE — Evaluation (Signed)
Physical Therapy One Time Evaluation Patient Details Name: [name] MRN: [id number] DOB: 03-28-1961 Today's Date: 12/14/2014   History of Present Illness  54 year old female admitted to [facility] for depression with hx of chronic right knee pain and being deaf.  Clinical Impression  Patient evaluated by Physical Therapy with no further acute PT needs identified. All education has been completed and the patient has no further questions. Pt with antalgic gait pattern and pain to R knee medial joint line with palpation.  Pt reports being diagnosed with meniscal tear by Dr. Luiz Blare, who she reports recommended knee brace and surgery however she did not f/u with surgery due to her mother passing away.  Pt with pain since diagnosis so encouraged pt to f/u with ortho MD as soon as possible and continue with using knee brace and SPC for pain control with mobility.  Also discussed recommendation for knee brace and SPC while at [facility] to RN. Would not recommend further PT f/u until pt reevaluated by ortho MD. PT is signing off. Thank you for this referral.     Follow Up Recommendations No PT follow up (recommended pt f/u with ortho MD)    Equipment Recommendations  None recommended by PT    Recommendations for Other Services       Precautions / Restrictions Precautions Precautions: None      Mobility  Bed Mobility Overal bed mobility: Modified Independent                Transfers Overall transfer level: Modified independent                  Ambulation/Gait Ambulation/Gait assistance: Modified independent (Device/Increase time) Ambulation Distance (Feet): 100 Feet Assistive device: None Gait Pattern/deviations: Antalgic;Decreased stance time - right;Decreased step length - left     General Gait Details: pt declined using RW, states she uses SPC when pain increased at home, ambulated near hall rail for support if she needed it  Careers information officer     Modified Rankin (Stroke Patients Only)       Balance Overall balance assessment: No apparent balance deficits (not formally assessed) (gait pattern due to pain)                                           Pertinent Vitals/Pain Pain Assessment: 0-10 Pain Score: 5  Pain Location: R knee with end ROM and transfers, none at rest Pain Descriptors / Indicators: Discomfort Pain Intervention(s): Monitored during session    Home Living   Living Arrangements: Other relatives (Lives with brother but they are being told to move out)               Additional Comments: per chart review    Prior Function Level of Independence: Independent               Hand Dominance        Extremity/Trunk Assessment               Lower Extremity Assessment: RLE deficits/detail RLE Deficits / Details: pain with end range of motion extension and flexion, tender to palpation at medial joint line, reports pain mostly deep and medial side, pain with weight bearing and transfers       Communication   Communication: Interpreter utilized  Cognition Arousal/Alertness:  Awake/alert Behavior During Therapy: WFL for tasks assessed/performed Overall Cognitive Status: Within Functional Limits for tasks assessed                      General Comments      Exercises        Assessment/Plan    PT Assessment Patent does not need any further PT services  PT Diagnosis     PT Problem List    PT Treatment Interventions     PT Goals (Current goals can be found in the Care Plan section) Acute Rehab PT Goals PT Goal Formulation: All assessment and education complete, DC therapy    Frequency     Barriers to discharge        Co-evaluation               End of Session   Activity Tolerance: Patient tolerated treatment well Patient left: Other (comment) (in her room with interpreter) Nurse Communication: Mobility status (recommendation for [facility] since pt  declines RW and wearing her brace)    Functional Assessment Tool Used: clinical judgement Functional Limitation: Mobility: Walking and moving around Mobility: Walking and Moving Around Current Status (R6045): At least 1 percent but less than 20 percent impaired, limited or restricted Mobility: Walking and Moving Around Goal Status [phone]): 0 percent impaired, limited or restricted Mobility: Walking and Moving Around Discharge Status [phone]): 0 percent impaired, limited or restricted    Time: 1512-1530 PT Time Calculation (min) (ACUTE ONLY): 18 min   Charges:   PT Evaluation $Initial PT Evaluation Tier I: 1 Procedure     PT G Codes:   PT G-Codes **NOT FOR INPATIENT CLASS** Functional Assessment Tool Used: clinical judgement Functional Limitation: Mobility: Walking and moving around Mobility: Walking and Moving Around Current Status (W2956): At least 1 percent but less than 20 percent impaired, limited or restricted Mobility: Walking and Moving Around Goal Status [phone]): 0 percent impaired, limited or restricted Mobility: Walking and Moving Around Discharge Status [phone]): 0 percent impaired, limited or restricted    Nari Vannatter,KATHrine E 12/14/2014, 3:56 PM Zenovia Jarred, PT, DPT 12/14/2014 Pager: [phone]

## 2014-12-14 NOTE — Progress Notes (Signed)
Patient ID: [name], female   DOB: 09/16/1960, 54 y.o.   MRN: [id number] [facility] MD Progress Note  12/14/2014 6:05 PM [name]  MRN:  [id number] Subjective:   Patient states "I am a little less depressed. I still worry about the communication with my family. That is one of the factors that drives my depression. But I am learning from the groups. I enjoy attending and am learning some new coping skills. I do tend to isolate from my family when I am very depressed. I know that does not help anything."   Objective:  Patient is seen and chart is reviewed. She is assessed on a daily basis with the help of sign language interpreter. Patient continues to reports symptoms of depression but less anxiety. Rates at a five today. Reports sadness that family have not come to visit her while in the hospital.  Denies suicidal ideation.  Patient worries about her lack of ability to communicate with them and housing has become a concern since her mother's death a few months ago. The social worker reported in treatment team that her daughter has found her a place to stay for the next month. She is active on the unit and is attending groups. She is tolerating her Zoloft with no reported side effects of nausea. Her daughter was contacted today to bring patient's cell phone during evening visitation so that she could skype with friends and family in her room. Patient appears very motivated to improve communication with her family. She is also still mourning the recent death of her mother earlier this year.   Principal Problem: Recurrent major depression-severe Diagnosis:   Patient Active Problem List   Diagnosis Date Noted  . 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 Recurrent major depression-severe [F33.2] 12/09/2014  . Difficulty hearing [H91.90] 03/02/2014   Total Time spent with patient: 15 minutes  Past Medical History:  Past Medical History  Diagnosis Date   . Deaf   . Chronic pain of right knee   . Anxiety   . Depression     Past Surgical History  Procedure Laterality Date  . Wisdom tooth extraction    . Abdominal hysterectomy    . Cholecystectomy     Family History: History reviewed. No pertinent family history. Social History:  History  Alcohol Use: Not on file     History  Drug Use Not on file    History   Social History  . Marital Status: Single    Spouse Name: N/A  . Number of Children: N/A  . Years of Education: N/A   Social History Main Topics  . Smoking status: Never Smoker   . Smokeless tobacco: Not on file  . Alcohol Use: Not on file  . Drug Use: Not on file  . Sexual Activity: Not on file   Other Topics Concern  . None   Social History Narrative   Additional History:    Sleep: Good  Appetite:  Good  Musculoskeletal: Strength & Muscle Tone: within normal limits Gait & Station: normal Patient leans: N/A  Psychiatric Specialty Exam: Physical Exam  Review of Systems  Constitutional: Negative.   HENT: Negative.   Eyes: Negative.   Respiratory: Negative.   Cardiovascular: Negative.   Gastrointestinal: Negative.   Genitourinary: Negative.   Musculoskeletal: Positive for joint pain (Reports chronic knee pain that is relieved by motrin. ).  Skin: Negative.   Neurological: Negative.   Endo/Heme/Allergies: Negative.   Psychiatric/Behavioral:  Positive for depression. Negative for suicidal ideas, hallucinations, memory loss and substance abuse. The patient is nervous/anxious. The patient does not have insomnia.     Blood pressure 141/79, pulse 75, temperature 97.5 F (36.4 C), temperature source Oral, resp. rate 16, height  (1.651 m), weight 146.965 kg (324 lb), SpO2 99 %.Body mass index is 53.92 kg/(m^2).  General Appearance: Casual  Eye Contact::  Good  Speech:  Unable to assess due to being deaf   Volume:  N/A  Mood:  Anxious  Affect:  Constricted  Thought Process:  Goal Directed and  Intact  Orientation:  Full (Time, Place, and Person)  Thought Content:  Symptoms, worries, concerns   Suicidal Thoughts:  No  Homicidal Thoughts:  No  Memory:  Immediate;   Good Recent;   Good Remote;   Good  Judgement:  Fair  Insight:  Present  Psychomotor Activity:  Normal  Concentration:  Good  Recall:  Good  Fund of Knowledge:Good  Language: Good  Akathisia:  No  Handed:  Right  AIMS (if indicated):     Assets:  Communication Skills Desire for Improvement Leisure Time Resilience  ADL's:  Intact  Cognition: WNL  Sleep:  Number of Hours: 5    Current Medications: Current Facility-Administered Medications  Medication Dose Route Frequency Provider Last Rate Last Dose  . acetaminophen (TYLENOL) tablet 650 mg  650 mg Oral Q6H PRN Adonis Brook, NP      . alum & mag hydroxide-simeth (MAALOX/MYLANTA) 200-200-20 MG/5ML suspension 30 mL  30 mL Oral Q4H PRN Adonis Brook, NP      . hydrOXYzine (ATARAX/VISTARIL) tablet 25 mg  25 mg Oral Q6H PRN Thermon Leyland, NP   25 mg at 12/13/14 2153  . ibuprofen (ADVIL,MOTRIN) tablet 600 mg  600 mg Oral Q6H PRN Thermon Leyland, NP   600 mg at 12/13/14 2153  . magnesium hydroxide (MILK OF MAGNESIA) suspension 30 mL  30 mL Oral Daily PRN Adonis Brook, NP      . metoprolol tartrate (LOPRESSOR) tablet 12.5 mg  12.5 mg Oral BID Thermon Leyland, NP   12.5 mg at 12/14/14 1622  . sertraline (ZOLOFT) tablet 100 mg  100 mg Oral Daily Thermon Leyland, NP   100 mg at 12/14/14 0802  . traZODone (DESYREL) tablet 50 mg  50 mg Oral QHS PRN Adonis Brook, NP   50 mg at 12/13/14 2254    Lab Results: No results found for this or any previous visit (from the past 48 hour(s)).  Physical Findings: AIMS: Facial and Oral Movements Muscles of Facial Expression: None, normal Lips and Perioral Area: None, normal Jaw: None, normal Tongue: None, normal,Extremity Movements Upper (arms, wrists, hands, fingers): None, normal Lower (legs, knees, ankles, toes): None,  normal, Trunk Movements Neck, shoulders, hips: None, normal, Overall Severity Severity of abnormal movements (highest score from questions above): None, normal Incapacitation due to abnormal movements: None, normal Patient's awareness of abnormal movements (rate only patient's report): No Awareness, Dental Status Current problems with teeth and/or dentures?: No Does patient usually wear dentures?: No  CIWA:    COWS:     Treatment Plan Summary: Daily contact with patient to assess and evaluate symptoms and progress in treatment and Medication management  Continue crisis management and stabilization.  Medication management:  Continue Zoloft to 100 mg daily for depression Continue Trazodone to 50 mg hs prn for insomnia  Continue Vistaril 25 mg every six hours prn anxiety Continue motrin  600 mg  every six hours prn chronic knee pain.  Encouraged patient to attend groups and participate in group counseling sessions and activities.  Discharge plan in progress.  Continue current treatment plan.  Address health issues: Continue Lopressor 12.5 mg bid for elevated blood pressure/anxiety. Her vitals have shown improvement since being started on this medication.  Will work with grief and loss Physical therapy consult for unstable gait related to chronic knee pain, recommendation to allow patient to use cane on the unit.   Medical Decision Making:  Established Problem, Stable/Improving (1), Review of Psycho-Social Stressors (1), Review or order clinical lab tests (1) and Review of Medication Regimen & Side Effects (2)  DAVIS, LAURA, NP-C 12/14/2014, 6:05 PM I agree with assessment and plan Madie Reno A. Dub Mikes, M.D.

## 2014-12-15 NOTE — Progress Notes (Signed)
D: Pt denies SI/HI/AVH. Pt is pleasant and cooperative. Pt stated she was doing much better , she was sleeping and feeling much better.  A: Pt was offered support and encouragement. Pt was given scheduled medications. Pt was encourage to attend groups. Q 15 minute checks were done for safety.   R:Pt attends groups and interacts well with peers and staff. Pt is taking medication. Pt has no complaints at this time.Pt receptive to treatment and safety maintained on unit.

## 2014-12-15 NOTE — Progress Notes (Signed)
[facility] MD Progress Note  12/15/2014 6:43 PM [name]  MRN:  [id number] Subjective:  Braxton states she is feeling better. She was notified that a couple friend's of her will allow her to stay with them until her situation gets better. She is still concerned about what happened with the will as she thought her mother was going to leave the house to her and now his brother who has lived in that house most of his life and her are being asked to leave. Seems that one of her sister's has a Clinical research associate involved. 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 She did not return to work out of being completely immobilized with the depression. She needs to deal with HR. She is still dealing with the death of her mother. She realizes that her whole life has changed. She used to go to work come home go to work come home. Now without her job she needs to find things to do.  Principal Problem: Recurrent major depression-severe Diagnosis:   Patient Active Problem List   Diagnosis Date Noted  . 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 Recurrent major depression-severe [F33.2] 12/09/2014  . Difficulty hearing [H91.90] 03/02/2014   Total Time spent with patient: 30 minutes   Past Medical History:  Past Medical History  Diagnosis Date  . Deaf   . Chronic pain of right knee   . Anxiety   . Depression     Past Surgical History  Procedure Laterality Date  . Wisdom tooth extraction    . Abdominal hysterectomy    . Cholecystectomy     Family History: History reviewed. No pertinent family history. Social History:  History  Alcohol Use: Not on file     History  Drug Use Not on file    History   Social History  . Marital Status: Single    Spouse Name: N/A  . Number of Children: N/A  . Years of Education: N/A   Social History Main Topics  . Smoking status: Never Smoker   . Smokeless tobacco: Not on file  . Alcohol Use: Not on file   . Drug Use: Not on file  . Sexual Activity: Not on file   Other Topics Concern  . None   Social History Narrative   Additional History:    Sleep: Fair  Appetite:  Fair   Assessment:   Musculoskeletal: Strength & Muscle Tone: within normal limits Gait & Station: normal Patient leans: normal   Psychiatric Specialty Exam: Physical Exam  Review of Systems  Constitutional: Negative.   HENT:       Deaf  Eyes: Negative.   Respiratory: Negative.   Cardiovascular: Negative.   Gastrointestinal: Negative.   Genitourinary: Negative.   Musculoskeletal: Negative.   Skin: Negative.   Neurological: Negative.   Endo/Heme/Allergies: Negative.   Psychiatric/Behavioral: Positive for depression.    Blood pressure 132/94, pulse 104, temperature 97.5 F (36.4 C), temperature source Oral, resp. rate 16, height  (1.651 m), weight 146.965 kg (324 lb), SpO2 99 %.Body mass index is 53.92 kg/(m^2).  General Appearance: Fairly Groomed  Patent attorney::  Fair  Speech:  sign language  Volume:  sing language  Mood:  "much better"  Affect:  Appropriate  Thought Process:  Coherent and Goal Directed  Orientation:  Full (Time, Place, and Person)  Thought Content:  evnets worries concerns plans as she moves forward  Suicidal Thoughts:  No  Homicidal Thoughts:  No  Memory:  Immediate;   Fair Recent;   Fair Remote;   Fair  Judgement:  Fair  Insight:  Present  Psychomotor Activity:  Normal  Concentration:  Fair  Recall:  [employer] of Knowledge:Fair  Language: Fair  Akathisia:  No  Handed:  Right  AIMS (if indicated):     Assets:  Desire for Improvement  ADL's:  Intact  Cognition: WNL  Sleep:  Number of Hours: 6.5     Current Medications: Current Facility-Administered Medications  Medication Dose Route Frequency Provider Last Rate Last Dose  . acetaminophen (TYLENOL) tablet 650 mg  650 mg Oral Q6H PRN Adonis Brook, NP      . alum & mag hydroxide-simeth (MAALOX/MYLANTA)  200-200-20 MG/5ML suspension 30 mL  30 mL Oral Q4H PRN Adonis Brook, NP      . hydrOXYzine (ATARAX/VISTARIL) tablet 25 mg  25 mg Oral Q6H PRN Thermon Leyland, NP   25 mg at 12/14/14 2251  . ibuprofen (ADVIL,MOTRIN) tablet 600 mg  600 mg Oral Q6H PRN Thermon Leyland, NP   600 mg at 12/14/14 2251  . magnesium hydroxide (MILK OF MAGNESIA) suspension 30 mL  30 mL Oral Daily PRN Adonis Brook, NP      . metoprolol tartrate (LOPRESSOR) tablet 12.5 mg  12.5 mg Oral BID Thermon Leyland, NP   12.5 mg at 12/15/14 1659  . sertraline (ZOLOFT) tablet 100 mg  100 mg Oral Daily Thermon Leyland, NP   100 mg at 12/15/14 0801  . traZODone (DESYREL) tablet 50 mg  50 mg Oral QHS PRN Adonis Brook, NP   50 mg at 12/14/14 2251    Lab Results: No results found for this or any previous visit (from the past 48 hour(s)).  Physical Findings: AIMS: Facial and Oral Movements Muscles of Facial Expression: None, normal Lips and Perioral Area: None, normal Jaw: None, normal Tongue: None, normal,Extremity Movements Upper (arms, wrists, hands, fingers): None, normal Lower (legs, knees, ankles, toes): None, normal, Trunk Movements Neck, shoulders, hips: None, normal, Overall Severity Severity of abnormal movements (highest score from questions above): None, normal Incapacitation due to abnormal movements: None, normal Patient's awareness of abnormal movements (rate only patient's report): No Awareness, Dental Status Current problems with teeth and/or dentures?: No Does patient usually wear dentures?: No  CIWA:    COWS:     Treatment Plan Summary: Daily contact with patient to assess and evaluate symptoms and progress in treatment and Medication management Supportive approach/coping skills Depression; continue the Zoloft at 100 mg and work with CBT/mindfulness Will encourage behavioral activation/involvement once she gets back home    Medical Decision Making:  Review of Psycho-Social Stressors (1), Review of Medication  Regimen & Side Effects (2) and Review of New Medication or Change in Dosage (2)     [place] Churchill A 12/15/2014, 6:43 PM

## 2014-12-15 NOTE — [facility] Group Notes (Signed)
[facility] LCSW Group Therapy 12/15/2014 1:15 PM  Type of Therapy: Group Therapy- Emotion Regulation  Participation Level: Active   Participation Quality:  Appropriate  Affect: Appropriate  Cognitive: Alert and Oriented   Insight:  Developing/Improving  Engagement in Therapy: Developing/Improving and Engaged   Modes of Intervention: Clarification, Confrontation, Discussion, Education, Exploration, Limit-setting, Orientation, Problem-solving, Rapport Building, Dance movement psychotherapist, Socialization and Support  Summary of Progress/Problems: The topic for group today was emotional regulation. This group focused on both positive and negative emotion identification and allowed group members to process ways to identify feelings, regulate negative emotions, and find healthy ways to manage internal/external emotions. Group members were asked to reflect on a time when their reaction to an emotion led to a negative outcome and explored how alternative responses using emotion regulation would have benefited them. Group members were also asked to discuss a time when emotion regulation was utilized when a negative emotion was experienced. Pt was attentive to group discussion. She was able to express that coloring, listening to music, and using positive imagery as ways she is able to calm down and relax.     Chad Cordial, LCSWA 12/15/2014 3:53 PM

## 2014-12-15 NOTE — Progress Notes (Signed)
Patient ID: [name], female   DOB: [birth year], 54 y.o.   MRN: [id number] D: Patient in dayroom on approach. With help from interpreter pt reports her day was good and looking forward to discharge tomorrow. Pt reports she is tolerating medication well. Pt denies SI/HI/AVH. Pt attended and participated in evening wrap up group. Cooperative with assessment. No acute distressed noted at this time.   A: Met with pt 1:1. Medications administered as prescribed. Support and encouragement provided. Pt encouraged to discuss feelings and come to staff with any question or concerns.   R: Patient remains safe and complaint with medications.

## 2014-12-15 NOTE — Progress Notes (Signed)
Recreation Therapy Notes  Date: 08.03.16 Time: 930 am Location: [address] Group Room  Group Topic: Stress Management  Goal Area(s) Addresses:  Patient will verbalize importance of using healthy stress management.  Patient will identify positive emotions associated with healthy stress management.   Intervention: Stress Management  Activity : Guided Imagery Script. LRT will introduce and instruct patients on the stress management technique of guided imagery. Patients were asked to follow a long with a script read a loud by LRT to participate in the stress management technique of guided imagery.  Education: Stress Management, Discharge Planning.   Education Outcome: Acknowledges edcuation/In group clarification offered/Needs additional education  Clinical Observations/Feedback: Patient did not attend group.   Langford Carias, LRT/CTRS        [name] A 12/15/2014 2:43 PM 

## 2014-12-15 NOTE — BHH Group Notes (Signed)
[facility] LCSW Aftercare Discharge Planning Group Note  12/15/2014 8:45 AM  Participation Quality: Alert, Appropriate and Oriented  Mood/Affect: Appropriate  Depression Rating: 0  Anxiety Rating: 3  Thoughts of Suicide: Pt denies SI/HI  Will you contract for safety? Yes  Current AVH: Pt denies  Plan for Discharge/Comments: Pt attended discharge planning group and actively participated in group. CSW discussed suicide prevention education with the group and encouraged them to discuss discharge planning and any relevant barriers. Pt reports feeling better and less anxious about discharge.  She expressed that she is feeling ready to go when the doctor is agreeable.   Transportation Means: Pt reports access to transportation  Supports: No supports mentioned at this time  Chad Cordial, LCSWA 12/15/2014 3:52 PM

## 2014-12-15 NOTE — BHH Group Notes (Signed)
BHH Group Notes:  (Nursing/MHT/Case Management/Adjunct)  Date:  12/15/2014  Time:  10:05 PM  Type of Therapy:  Psychoeducational Skills  Participation Level:  Active  Participation Quality:  Appropriate  Affect:  Appropriate  Cognitive:  Alert  Insight:  Appropriate  Engagement in Group:  Engaged  Modes of Intervention:  Discussion  Summary of Progress/Problems: On a scale between 1-10, patient stated her day as a 10. Patient also states her goal is to think positive, and she is ready to go home tomorrow.   [name] L Tahni Porchia 12/15/2014, 10:05 PM

## 2014-12-15 NOTE — Plan of Care (Signed)
Problem: Alteration in mood; excessive anxiety as evidenced by: Goal: LTG-Patient's behavior demonstrates decreased anxiety (Patient's behavior demonstrates anxiety and he/she is utilizing learned coping skills to deal with anxiety-producing situations)  Outcome: Progressing Pt stated she was getting sleep at night

## 2014-12-15 NOTE — Progress Notes (Signed)
D: Per patient self inventory form patient reports she slept fair last night with the use of sleep medication. She reports a good appetite, normal energy level, good concentration. She rates depression 3/10, hopelessness 0/10, anxiety 3/10- all on 0-10 scale, 10 being the worse.  She reports her goal for the day "continue to focus myself better - stay good positive." Interpretor present this morning. She denies SI/HI. C/O knee pain. Expresses a strong desire to be discharged.   A:Special checks q 15 mins in place for safety. Medication administered per MD order (see eMAR). Patient decline medication for c/o knee pain. Interpretor present for communication. Encouragement and support provided.  R:Safety maintained. Compliant with medication regimen. Observed in the day room this morning.

## 2014-12-16 ENCOUNTER — Encounter (HOSPITAL_COMMUNITY): Payer: Self-pay | Admitting: Registered Nurse

## 2014-12-16 MED ORDER — HYDROXYZINE HCL 25 MG PO TABS
25.0000 mg | ORAL_TABLET | Freq: Three times a day (TID) | ORAL | Status: DC | PRN
  Filled 2014-12-16 (×2): qty 9

## 2014-12-16 MED ORDER — METOPROLOL TARTRATE 25 MG PO TABS: 12.5000 mg | ORAL_TABLET | Freq: Two times a day (BID) | ORAL | Status: DC

## 2014-12-16 MED ORDER — HYDROXYZINE HCL 25 MG PO TABS: 25.0000 mg | ORAL_TABLET | Freq: Three times a day (TID) | ORAL | Status: DC | PRN

## 2014-12-16 MED ORDER — SERTRALINE HCL 100 MG PO TABS: 100.0000 mg | ORAL_TABLET | Freq: Every day | ORAL | Status: DC

## 2014-12-16 MED ORDER — TRAZODONE HCL 50 MG PO TABS: 50.0000 mg | ORAL_TABLET | Freq: Every evening | ORAL | Status: DC | PRN

## 2014-12-16 NOTE — Progress Notes (Signed)
  [facility] Adult Case Management Discharge Plan :  Will you be returning to the same living situation after discharge:  No. Pt will be going to live with a friend At discharge, do you have transportation home?: Yes,  friend, Vikki Ports, to provide transportation Do you have the ability to pay for your medications: Yes,  Provided with samples and prescriptions  Release of information consent forms completed and in the chart;  Patient's signature needed at discharge.  Patient to Follow up at: Follow-up Information    Follow up with [facility] On 12/28/2014.   Why:  between 10-11:30am for medication management with Dr. Valerie Salts information:   [address]  [place] [phone]      Patient denies SI/HI: Yes,  Pt denies    Safety Planning and Suicide Prevention discussed: Yes,  with daughter; see SPE note for further details  Have you used any form of tobacco in the last 30 days? (Cigarettes, Smokeless Tobacco, Cigars, and/or Pipes): No  Has patient been referred to the Quitline?: N/A patient is not a smoker  Elaina Hoops 12/16/2014, 4:05 PM

## 2014-12-16 NOTE — Progress Notes (Signed)
Discharge note: Patient discharged per MD order. Interpretor present during review of discharge summary. Pt expresses understanding of discharge summary. Sample medication and RX given to pt. All items returned to patient from locker. Pt signs that she received all personal items from the locker. She denies SI/HI at discharge. Eager to be discharged. Ambulatory out of facility. Pt friend in lobby for discharge

## 2014-12-16 NOTE — Progress Notes (Addendum)
D: Per patient self inventory form patient reports she slept good last night with the use of sleep medication. She reports a good appetite, normal energy level, good concentration. She rates depression 0/10, hopelessness 0/10, anxiety 0/10- all on 0-10 scale- 10 being the worse. She reports her goal for the day is "stay positive." She reports "stay positive and focus myself better" is what she will do today to help meet her goal. Interpretor on the unit. She denies SI/HI. Denies A/V/H. Patient expresses she is eager to be discharged today.  A:Special checks q 15 mins in place for safety. Medication administered per MD order (see eMAR). Encouragement and support provided. Discharge planning in progress. Interpretor on unit   R:Safety maintained. Compliant with medication regimen. Will continue to monitor.

## 2014-12-16 NOTE — BHH Suicide Risk Assessment (Signed)
[facility] Discharge Suicide Risk Assessment   Demographic Factors:  Caucasian  Total Time spent with patient: 30 minutes  Musculoskeletal: Strength & Muscle Tone: within normal limits Gait & Station: normal Patient leans: normal  Psychiatric Specialty Exam: Physical Exam  Review of Systems  Constitutional: Negative.   HENT:       Deaf  Eyes: Negative.   Respiratory: Negative.   Cardiovascular: Negative.   Gastrointestinal: Negative.   Genitourinary: Negative.   Musculoskeletal: Negative.   Skin: Negative.   Neurological: Negative.   Endo/Heme/Allergies: Negative.   Psychiatric/Behavioral: Positive for depression.    Blood pressure 121/66, pulse 70, temperature 98.2 F (36.8 C), temperature source Oral, resp. rate 18, height  (1.651 m), weight 146.965 kg (324 lb), SpO2 99 %.Body mass index is 53.92 kg/(m^2).  General Appearance: Fairly Groomed  Patent attorney::  Fair  Speech:  sign [phone]  Volume:  sign language  Mood:  Euthymic  Affect:  Appropriate  Thought Process:  Coherent and Goal Directed  Orientation:  Full (Time, Place, and Person)  Thought Content:  plans as she moves on  Suicidal Thoughts:  No  Homicidal Thoughts:  No  Memory:  Immediate;   Fair Recent;   Fair Remote;   Poor  Judgement:  Fair  Insight:  Present  Psychomotor Activity:  Normal  Concentration:  Fair  Recall:  [employer] of Knowledge:Fair  Language: Fair  Akathisia:  No  Handed:  Right  AIMS (if indicated):     Assets:  Desire for Improvement Social Support  Sleep:  Number of Hours: 6.75  Cognition: WNL  ADL's:  Intact   Have you used any form of tobacco in the last 30 days? (Cigarettes, Smokeless Tobacco, Cigars, and/or Pipes): No  Has this patient used any form of tobacco in the last 30 days? (Cigarettes, Smokeless Tobacco, Cigars, and/or Pipes) No  Mental Status Per Nursing Assessment::   On Admission:     Current Mental Status by Physician: In full contact with reality.  There are no active SI plans or intent. She has tolerated the increase in Zoloft well, no side effects. She is going to pursue outpatient follow up   Loss Factors: loss of her mother, dog, job   Historical Factors: NA  Risk Reduction Factors:   Positive social support  Continued Clinical Symptoms:  Depression:   Severe  Cognitive Features That Contribute To Risk:  None    Suicide Risk:  Minimal: No identifiable suicidal ideation.  Patients presenting with no risk factors but with morbid ruminations; may be classified as minimal risk based on the severity of the depressive symptoms  Principal Problem: Recurrent major depression-severe Discharge Diagnoses:  Patient Active Problem List   Diagnosis Date Noted  . 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 Recurrent major depression-severe [F33.2] 12/09/2014  . Difficulty hearing [H91.90] 03/02/2014    Follow-up Information    Follow up with [facility] On 12/28/2014.   Why:  between 10-11:30am for medication management with Dr. Valerie Salts information:   [address]  [place] [phone]      Plan Of Care/Follow-up recommendations:  Activity:  as tolerated Diet:  regular Follow up outpatient basis Is patient on multiple antipsychotic therapies at discharge:  No   Has Patient had three or more failed trials of antipsychotic monotherapy by history:  No  Recommended Plan for Multiple Antipsychotic Therapies: NA    Kimesha Claxton A 12/16/2014, 1:34  PM

## 2014-12-16 NOTE — Plan of Care (Signed)
Problem: Alteration in mood Goal: STG-Pt Able to Identify Plan For Continuing Care at D/C Pt. Will be able to identify a plan for continuing care at discharge  Outcome: Progressing Pt reports she will discharge tomorrow to live with a friend

## 2014-12-16 NOTE — BHH Group Notes (Signed)
BHH Group Notes:  (Nursing/MHT/Case Management/Adjunct)  Date:  12/16/2014  Time:  0900 Type of Therapy:  Nurse Education  Participation Level:  Active  Participation Quality:  Appropriate and Attentive  Affect:  Appropriate  Cognitive:  Alert and Oriented  Insight:  Good  Engagement in Group:  Engaged  Modes of Intervention:  Discussion, Education and Support  Summary of Progress/Problems:  [name] 12/16/2014, 10:06 AM

## 2014-12-16 NOTE — Tx Team (Signed)
Interdisciplinary Treatment Plan Update (Adult) Date: 12/16/2014   Time Reviewed: 9:30 AM  Progress in Treatment: Attending groups: Yes Participating in groups: Yes Taking medication as prescribed: Yes Tolerating medication: Yes Family/Significant other contact made: Yes, CSW has spoken with patient's daughter Patient understands diagnosis: Yes Discussing patient identified problems/goals with staff: Yes Medical problems stabilized or resolved: Yes Denies suicidal/homicidal ideation: Yes Issues/concerns per patient self-inventory: Yes Other:  New problem(s) identified: N/A  Discharge Plan or Barriers:   12/16/2014: Patient plans to stay with a friend at discharge and follow up with [place] in [facility].  Reason for Continuation of Hospitalization:  Depression Anxiety Medication Stabilization   Comments: N/A  Estimated length of stay: 1-2  days   Patient is a 54 year old female admitted for increasing depression.  Patient will benefit from crisis stabilization, medication evaluation, group therapy, and psycho education in addition to case management for discharge planning. Patient and CSW reviewed pt's identified goals and treatment plan. Pt verbalized understanding and agreed to treatment plan.     Review of initial/current patient goals per problem list:  1. Goal(s): Patient will participate in aftercare plan   Met: Yes   Target date: 3-5 days post admission date   As evidenced by: Patient will participate within aftercare plan AEB aftercare provider and housing plan at discharge being identified.  12/16/2014: Goal met: Patient plans to stay with a friend at discharge and follow up with outpatient services.   2. Goal (s): Patient will exhibit decreased depressive symptoms and suicidal ideations.   Met: Yes   Target date: 3-5 days post admission date   As evidenced by: Patient will utilize self rating of depression at 3 or below and demonstrate decreased signs  of depression or be deemed stable for discharge by MD.  12/16/2014: Pt rates depression at 0/10.    3. Goal(s): Patient will demonstrate decreased signs and symptoms of anxiety.   Met: Yes   Target date: 3-5 days post admission date   As evidenced by: Patient will utilize self rating of anxiety at 3 or below and demonstrated decreased signs of anxiety, or be deemed stable for discharge by MD  12/16/2014: Pt rates anxiety at 2/10 today.     Attendees: Patient:    Family:    Physician: Dr. Sabra Heck 12/14/2014 9:30 AM  Nursing:  Markham Jordan, Darrol Angel ,RN 12/14/2014 9:30 AM  Clinical Social Worker: Tilden Fossa,  [place] 12/14/2014 9:30 AM  Other: Peri Maris, LCSWA  12/14/2014 9:30 AM  Other: Lucinda Dell, Beverly Sessions Liaison 12/14/2014 9:30 AM  Other: Lars Pinks, Case Manager 12/14/2014 9:30 AM  Other: Ave Filter , NP 12/14/2014 9:30 AM  Other:    Other:      Scribe for Treatment Team:  Tilden Fossa, MSW, [place] [phone]

## 2014-12-16 NOTE — Discharge Summary (Signed)
Physician Discharge Summary Note  Patient:  [name] is an 54 y.o., female MRN:  [id number] DOB:  10-06-1960 Patient phone:  [phone] (home)  Patient address:   [address] [place] [address],  Total Time spent with patient: 45 minutes  Date of Admission:  12/09/2014 Date of Discharge: 12/16/2014  Reason for Admission:  Per H&P Note:  [name] is a 54 year old female who presented to the [facility] with complaints of worsening depression. She has a history of being deaf and has had an interpreter to assist with communication. Helon reported in the ED that her depression has worsened since her mother passed away four months ago. Patient stated through help from hospital interpreter "I'm very depressed. I did not think about killing myself. It goes back to November. I started being more withdrawn like I would not even answer the door. 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 I miss working. I worked for 29 years. I don't really have hobbies because working was my life. Things got worse after my mother died in 08-05-2022. My family does not communicate with me because I am deaf. I feel guilty about my mother even though I know she was sick for many years. It's not easy still living in her house with my brother. He said we have to be out by the end of this month. I don't know what will happen now. I am worried about my family situation. My daughter is buys with her kids and I feel helpless. I cry a lot and feel very fatigued. I was started on Zoloft in May and I started to feel less withdrawn. It has helped me. But sleep is still a problem. I wake up at 3 am and can't go back to sleep. I am scared to be here. I have never been in a mental health hospital. I do not hear voices. At times I thought I saw my mother's shadow after she passed. But that is not happening now. I will try to get Disability but that can take years." Charlayne participated well  during the assessment but appeared depressed. Denies any alcohol or drug use. Patient reports only having taken Zoloft in the past. She reports a positive response and is open to increasing the dose of the medication. Patient was also hopeful that the social worker could help her improve communication with her family members.   Principal Problem: Recurrent major depression-severe Discharge Diagnoses: Patient Active Problem List   Diagnosis Date Noted  . 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 Recurrent major depression-severe [F33.2] 12/09/2014  . Difficulty hearing [H91.90] 03/02/2014    Musculoskeletal: Strength & Muscle Tone: within normal limits Gait & Station: normal Patient leans: N/A  Psychiatric Specialty Exam:  See Suicide Risk Assessment Physical Exam  Constitutional: She is oriented to person, place, and time.  Neck: Normal range of motion.  Respiratory: Effort normal.  Musculoskeletal: Normal range of motion.  Neurological: She is alert and oriented to person, place, and time.    Review of Systems  HENT:       Difficulty hearing  Psychiatric/Behavioral: Negative for suicidal ideas and hallucinations. Depression: Stable. Nervous/anxious: Stable. Insomnia: Stable.     Blood pressure 121/66, pulse 70, temperature 98.2 F (36.8 C), temperature source Oral, resp. rate 18, height  (1.651 m), weight 146.965 kg (324 lb), SpO2 99 %.Body mass index is 53.92 kg/(m^2).  Have  you used any form of tobacco in the last 30 days? (Cigarettes, Smokeless Tobacco, Cigars, and/or Pipes): No  Has this patient used any form of tobacco in the last 30 days? (Cigarettes, Smokeless Tobacco, Cigars, and/or Pipes) No  Past Medical History:  Past Medical History  Diagnosis Date  . Deaf   . Chronic pain of right knee   . Anxiety   . Depression     Past Surgical History  Procedure Laterality Date  . Wisdom tooth extraction    . Abdominal  hysterectomy    . Cholecystectomy     Family History: History reviewed. No pertinent family history. Social History:  History  Alcohol Use: Not on file     History  Drug Use Not on file    History   Social History  . Marital Status: Single    Spouse Name: N/A  . Number of Children: N/A  . Years of Education: N/A   Social History Main Topics  . Smoking status: Never Smoker   . Smokeless tobacco: Not on file  . Alcohol Use: Not on file  . Drug Use: Not on file  . Sexual Activity: Not on file   Other Topics Concern  . None   Social History Narrative   Risk to Self: Is patient at risk for suicide?: Yes What has been your use of drugs/alcohol within the last 12 months?: Pt denies Risk to Others:   Prior Inpatient Therapy:   Prior Outpatient Therapy:    Level of Care:  OP  Hospital Course:  KENSEY LUEPKE was admitted for Recurrent major depression-severe and crisis management.  She was treated discharged with the medications listed below under Medication List.  Medical problems were identified and treated as needed.  Home medications were restarted as appropriate.  Improvement was monitored by observation and [name] daily report of symptom reduction.  Emotional and mental status was monitored by daily self-inventory reports completed by [name] and clinical staff.         LYNNEL ZANETTI was evaluated by the treatment team for stability and plans for continued recovery upon discharge.  [name] motivation was an integral factor for scheduling further treatment.  Employment, transportation, bed availability, health status, family support, and any pending legal issues were also considered during her hospital stay.  She was offered further treatment options upon discharge including but not limited to Residential, Intensive Outpatient, and Outpatient treatment.  KENNI NEWTON will follow up with the services as listed below under Follow Up  Information.     Upon completion of this admission the patient was both mentally and medically stable for discharge denying suicidal/homicidal ideation, auditory/visual/tactile hallucinations, delusional thoughts and paranoia.      Consults:  psychiatry  Significant Diagnostic Studies:  labs: Urinalysis, UDS, ETOH, CBC  Discharge Vitals:   Blood pressure 121/66, pulse 70, temperature 98.2 F (36.8 C), temperature source Oral, resp. rate 18, height 5\' 5"  (1.651 m), weight 146.965 kg (324 lb), SpO2 99 %. Body mass index is 53.92 kg/(m^2). Lab Results:   No results found for this or any previous visit (from the past 72 hour(s)).  Physical Findings: AIMS: Facial and Oral Movements Muscles of Facial Expression: None, normal Lips and Perioral Area: None, normal Jaw: None, normal Tongue: None, normal,Extremity Movements Upper (arms, wrists, hands, fingers): None, normal Lower (legs, knees, ankles, toes): None, normal, Trunk Movements Neck, shoulders, hips: None, normal, Overall Severity Severity of abnormal movements (highest score from  questions above): None, normal Incapacitation due to abnormal movements: None, normal Patient's awareness of abnormal movements (rate only patient's report): No Awareness, Dental Status Current problems with teeth and/or dentures?: No Does patient usually wear dentures?: No  CIWA:    COWS:      See Psychiatric Specialty Exam and Suicide Risk Assessment completed by Attending Physician prior to discharge.  Discharge destination:  Home  Is patient on multiple antipsychotic therapies at discharge:  No   Has Patient had three or more failed trials of antipsychotic monotherapy by history:  No    Recommended Plan for Multiple Antipsychotic Therapies: NA      Discharge Instructions    Activity as tolerated - No restrictions    Complete by:  As directed      Diet - low sodium heart healthy    Complete by:  As directed      Discharge instructions     Complete by:  As directed   Take all of you medications as prescribed by your mental healthcare provider.  Report any adverse effects and reactions from your medications to your outpatient provider promptly. Do not engage in alcohol and or illegal drug use while on prescription medicines. In the event of worsening symptoms call the crisis hotline, 911, and or go to the nearest emergency department for appropriate evaluation and treatment of symptoms. Follow-up with your primary care provider for your medical issues, concerns and or health care needs.   Keep all scheduled appointments.  If you are unable to keep an appointment call to reschedule.  Let the nurse know if you will need medications before next scheduled appointment.            Medication List    STOP taking these medications        ciprofloxacin 500 MG tablet  Commonly known as:  CIPRO     dorzolamide 2 % ophthalmic solution  Commonly known as:  TRUSOPT     furosemide 40 MG tablet  Commonly known as:  LASIX     ibuprofen 200 MG tablet  Commonly known as:  ADVIL,MOTRIN     metroNIDAZOLE 500 MG tablet  Commonly known as:  FLAGYL     Potassium Chloride Crys     promethazine 25 MG suppository  Commonly known as:  PHENERGAN     promethazine 25 MG tablet  Commonly known as:  PHENERGAN     traMADol 50 MG tablet  Commonly known as:  ULTRAM     XALATAN 0.005 % ophthalmic solution  Generic drug:  latanoprost      TAKE these medications      Indication   hydrOXYzine 25 MG tablet  Commonly known as:  ATARAX/VISTARIL  Take 1 tablet (25 mg total) by mouth 3 (three) times daily as needed for anxiety.   Indication:  Anxiety Neurosis     metoprolol tartrate 25 MG tablet  Commonly known as:  LOPRESSOR  Take 0.5 tablets (12.5 mg total) by mouth 2 (two) times daily.   Indication:  Feeling Anxious, High Blood Pressure     sertraline 100 MG tablet  Commonly known as:  ZOLOFT  Take 1 tablet (100 mg total) by mouth  daily.   Indication:  Major Depressive Disorder     traZODone 50 MG tablet  Commonly known as:  DESYREL  Take 1 tablet (50 mg total) by mouth at bedtime as needed for sleep.   Indication:  Trouble Sleeping       Follow-up Information  Follow up with [facility] On 12/28/2014.   Why:  between 10-11:30am for medication management with Dr. Valerie Salts information:   [address]  [place] [phone]      Follow-up recommendations:  Activity:  As tolerated Diet:  Low Sodium  Comments:   Patient has been instructed to take medications as prescribed; and report adverse effects to outpatient provider.  Follow up with primary doctor for any medical issues and If symptoms recur report to nearest emergency or crisis hot line.    Total Discharge Time: 45 minutes  Signed: Assunta Found, FNP-BC 12/16/2014, 2:40 PM   I personally assessed the patient and formulated the plan Madie Reno A. Dub Mikes, M.D.

## 2015-03-10 ENCOUNTER — Other Ambulatory Visit: Payer: Self-pay

## 2015-03-10 DIAGNOSIS — R0689 Other abnormalities of breathing: Secondary | ICD-10-CM

## 2015-03-28 ENCOUNTER — Other Ambulatory Visit: Payer: Self-pay | Admitting: Orthopedic Surgery

## 2015-03-31 ENCOUNTER — Ambulatory Visit: Payer: Federal, State, Local not specified - PPO

## 2015-04-04 NOTE — Progress Notes (Signed)
Patient's BMI 53.9, cannot be done at [facility]. Dr Darene LamerGrave's scheduler notified.

## 2015-04-12 ENCOUNTER — Encounter (HOSPITAL_BASED_OUTPATIENT_CLINIC_OR_DEPARTMENT_OTHER): Payer: Self-pay | Admitting: *Deleted

## 2015-04-12 MED ORDER — CHLORHEXIDINE GLUCONATE 4 % EX LIQD: 60.0000 mL | Freq: Once | CUTANEOUS | Status: DC

## 2015-04-12 MED ORDER — VANCOMYCIN HCL 10 G IV SOLR
1500.0000 mg | INTRAVENOUS | Status: AC
  Administered 2015-04-13: 1500 mg via INTRAVENOUS
  Filled 2015-04-12: qty 1500

## 2015-04-12 NOTE — Progress Notes (Signed)
Pre op call completed using sign language interpreter ID [phone].

## 2015-04-13 ENCOUNTER — Encounter (HOSPITAL_COMMUNITY): Payer: Self-pay | Admitting: *Deleted

## 2015-04-13 ENCOUNTER — Ambulatory Visit (HOSPITAL_COMMUNITY): Payer: PRIVATE HEALTH INSURANCE | Admitting: Certified Registered Nurse Anesthetist

## 2015-04-13 ENCOUNTER — Ambulatory Visit (HOSPITAL_BASED_OUTPATIENT_CLINIC_OR_DEPARTMENT_OTHER)
Admission: RE | Admit: 2015-04-13 | Discharge: 2015-04-13 | Disposition: A | Discharge status: Home / Self Care | Payer: PRIVATE HEALTH INSURANCE | Source: Ambulatory Visit | Attending: Orthopedic Surgery | Admitting: Orthopedic Surgery

## 2015-04-13 ENCOUNTER — Encounter (HOSPITAL_COMMUNITY)
Admission: RE | Disposition: A | Discharge status: Home / Self Care | Payer: Self-pay | Source: Ambulatory Visit | Attending: Orthopedic Surgery

## 2015-04-13 DIAGNOSIS — M6751 Plica syndrome, right knee: Secondary | ICD-10-CM | POA: Diagnosis not present

## 2015-04-13 DIAGNOSIS — M2241 Chondromalacia patellae, right knee: Secondary | ICD-10-CM | POA: Diagnosis not present

## 2015-04-13 DIAGNOSIS — Z90710 Acquired absence of both cervix and uterus: Secondary | ICD-10-CM | POA: Insufficient documentation

## 2015-04-13 DIAGNOSIS — F419 Anxiety disorder, unspecified: Secondary | ICD-10-CM | POA: Diagnosis not present

## 2015-04-13 DIAGNOSIS — F329 Major depressive disorder, single episode, unspecified: Secondary | ICD-10-CM | POA: Diagnosis not present

## 2015-04-13 DIAGNOSIS — Z79899 Other long term (current) drug therapy: Secondary | ICD-10-CM | POA: Insufficient documentation

## 2015-04-13 DIAGNOSIS — S83241A Other tear of medial meniscus, current injury, right knee, initial encounter: Secondary | ICD-10-CM | POA: Insufficient documentation

## 2015-04-13 DIAGNOSIS — Z9049 Acquired absence of other specified parts of digestive tract: Secondary | ICD-10-CM | POA: Insufficient documentation

## 2015-04-13 DIAGNOSIS — Z880 Allergy status to penicillin: Secondary | ICD-10-CM | POA: Insufficient documentation

## 2015-04-13 HISTORY — PX: KNEE ARTHROSCOPY: SHX127

## 2015-04-13 HISTORY — DX: Unspecified osteoarthritis, unspecified site: M19.90

## 2015-04-13 LAB — BASIC METABOLIC PANEL
Anion gap: 9 (ref 5–15)
BUN: 14 mg/dL (ref 6–20)
CALCIUM: 9.2 mg/dL (ref 8.9–10.3)
CHLORIDE: 105 mmol/L (ref 101–111)
CO2: 25 mmol/L (ref 22–32)
CREATININE: 0.98 mg/dL (ref 0.44–1.00)
Glucose, Bld: 104 mg/dL — ABNORMAL HIGH (ref 65–99)
Potassium: 3.5 mmol/L (ref 3.5–5.1)
Sodium: 139 mmol/L (ref 135–145)

## 2015-04-13 LAB — CBC
HCT: 41.2 % (ref 36.0–46.0)
Hemoglobin: 13 g/dL (ref 12.0–15.0)
MCH: 27.4 pg (ref 26.0–34.0)
MCHC: 31.6 g/dL (ref 30.0–36.0)
MCV: 86.9 fL (ref 78.0–100.0)
PLATELETS: 237 10*3/uL (ref 150–400)
RBC: 4.74 MIL/uL (ref 3.87–5.11)
RDW: 15.3 % (ref 11.5–15.5)
WBC: 6.5 10*3/uL (ref 4.0–10.5)

## 2015-04-13 SURGERY — ARTHROSCOPY, KNEE
Anesthesia: General | Site: Knee | Laterality: Right

## 2015-04-13 MED ORDER — SUCCINYLCHOLINE CHLORIDE 20 MG/ML IJ SOLN
INTRAMUSCULAR | Status: AC
  Filled 2015-04-13: qty 1

## 2015-04-13 MED ORDER — ONDANSETRON HCL 4 MG/2ML IJ SOLN
INTRAMUSCULAR | Status: DC | PRN
  Administered 2015-04-13: 4 mg via INTRAVENOUS

## 2015-04-13 MED ORDER — LIDOCAINE HCL (CARDIAC) 20 MG/ML IV SOLN
INTRAVENOUS | Status: DC | PRN
  Administered 2015-04-13: 60 mg via INTRAVENOUS

## 2015-04-13 MED ORDER — LIDOCAINE HCL (CARDIAC) 20 MG/ML IV SOLN
INTRAVENOUS | Status: AC
  Filled 2015-04-13: qty 5

## 2015-04-13 MED ORDER — PHENYLEPHRINE HCL 10 MG/ML IJ SOLN
INTRAMUSCULAR | Status: DC | PRN
  Administered 2015-04-13 (×2): 40 ug via INTRAVENOUS

## 2015-04-13 MED ORDER — PROPOFOL 10 MG/ML IV BOLUS
INTRAVENOUS | Status: DC | PRN
  Administered 2015-04-13: 190 mg via INTRAVENOUS
  Administered 2015-04-13: 30 mg via INTRAVENOUS

## 2015-04-13 MED ORDER — EPHEDRINE SULFATE 50 MG/ML IJ SOLN
INTRAMUSCULAR | Status: DC | PRN
  Administered 2015-04-13 (×3): 5 mg via INTRAVENOUS

## 2015-04-13 MED ORDER — OXYCODONE-ACETAMINOPHEN 5-325 MG PO TABS: 1.0000 | ORAL_TABLET | Freq: Four times a day (QID) | ORAL | Status: DC | PRN

## 2015-04-13 MED ORDER — METOPROLOL TARTRATE 12.5 MG HALF TABLET
ORAL_TABLET | ORAL | Status: DC
Start: 2015-04-13 — End: 2015-04-13
  Filled 2015-04-13: qty 1

## 2015-04-13 MED ORDER — METOPROLOL TARTRATE 12.5 MG HALF TABLET
12.5000 mg | ORAL_TABLET | Freq: Once | ORAL | Status: AC
  Administered 2015-04-13: 12.5 mg via ORAL

## 2015-04-13 MED ORDER — FENTANYL CITRATE (PF) 250 MCG/5ML IJ SOLN
INTRAMUSCULAR | Status: AC
  Filled 2015-04-13: qty 5

## 2015-04-13 MED ORDER — SODIUM CHLORIDE 0.9 % IR SOLN
Status: DC | PRN
  Administered 2015-04-13 (×2): 3000 mL

## 2015-04-13 MED ORDER — ROCURONIUM BROMIDE 50 MG/5ML IV SOLN
INTRAVENOUS | Status: AC
  Filled 2015-04-13: qty 1

## 2015-04-13 MED ORDER — BUPIVACAINE HCL (PF) 0.25 % IJ SOLN
INTRAMUSCULAR | Status: DC | PRN
Start: 2015-04-13 — End: 2015-04-13
  Administered 2015-04-13: 10 mL

## 2015-04-13 MED ORDER — MIDAZOLAM HCL 5 MG/5ML IJ SOLN
INTRAMUSCULAR | Status: DC | PRN
  Administered 2015-04-13 (×2): 1 mg via INTRAVENOUS

## 2015-04-13 MED ORDER — SUCCINYLCHOLINE CHLORIDE 20 MG/ML IJ SOLN
INTRAMUSCULAR | Status: DC | PRN
  Administered 2015-04-13: 140 mg via INTRAVENOUS

## 2015-04-13 MED ORDER — LACTATED RINGERS IV SOLN
INTRAVENOUS | Status: DC | PRN
  Administered 2015-04-13: 15:00:00 via INTRAVENOUS

## 2015-04-13 MED ORDER — PHENYLEPHRINE 40 MCG/ML (10ML) SYRINGE FOR IV PUSH (FOR BLOOD PRESSURE SUPPORT)
PREFILLED_SYRINGE | INTRAVENOUS | Status: AC
  Filled 2015-04-13: qty 10

## 2015-04-13 MED ORDER — LACTATED RINGERS IV SOLN
INTRAVENOUS | Status: DC
  Administered 2015-04-13: 14:00:00 via INTRAVENOUS

## 2015-04-13 MED ORDER — BUPIVACAINE HCL (PF) 0.25 % IJ SOLN
INTRAMUSCULAR | Status: AC
  Filled 2015-04-13: qty 30

## 2015-04-13 MED ORDER — PROPOFOL 10 MG/ML IV BOLUS
INTRAVENOUS | Status: AC
  Filled 2015-04-13: qty 40

## 2015-04-13 MED ORDER — FENTANYL CITRATE (PF) 100 MCG/2ML IJ SOLN
INTRAMUSCULAR | Status: DC | PRN
  Administered 2015-04-13: 100 ug via INTRAVENOUS
  Administered 2015-04-13: 50 ug via INTRAVENOUS

## 2015-04-13 MED ORDER — MIDAZOLAM HCL 2 MG/2ML IJ SOLN
INTRAMUSCULAR | Status: AC
  Filled 2015-04-13: qty 2

## 2015-04-13 SURGICAL SUPPLY — 44 items
BANDAGE ELASTIC 6 VELCRO ST LF (GAUZE/BANDAGES/DRESSINGS) ×3 IMPLANT
BLADE CUDA 5.5 (BLADE) IMPLANT
BLADE CUTTER GATOR 3.5 (BLADE) IMPLANT
BLADE GREAT WHITE 4.2 (BLADE) ×2 IMPLANT
BLADE GREAT WHITE 4.2MM (BLADE) ×1
BNDG GAUZE ELAST 4 BULKY (GAUZE/BANDAGES/DRESSINGS) ×3 IMPLANT
CUFF TOURNIQUET SINGLE 34IN LL (TOURNIQUET CUFF) IMPLANT
CUFF TOURNIQUET SINGLE 44IN (TOURNIQUET CUFF) IMPLANT
DRAPE ARTHROSCOPY W/POUCH 114 (DRAPES) ×3 IMPLANT
DRAPE PROXIMA HALF (DRAPES) ×3 IMPLANT
DRAPE U-SHAPE 47X51 STRL (DRAPES) ×3 IMPLANT
DRSG ADAPTIC 3X8 NADH LF (GAUZE/BANDAGES/DRESSINGS) ×3 IMPLANT
DRSG EMULSION OIL 3X3 NADH (GAUZE/BANDAGES/DRESSINGS) ×3 IMPLANT
DRSG PAD ABDOMINAL 8X10 ST (GAUZE/BANDAGES/DRESSINGS) ×3 IMPLANT
DURAPREP 26ML APPLICATOR (WOUND CARE) ×3 IMPLANT
FILTER STRAW FLUID ASPIR (MISCELLANEOUS) IMPLANT
GAUZE SPONGE 4X4 12PLY STRL (GAUZE/BANDAGES/DRESSINGS) ×3 IMPLANT
GLOVE BIOGEL PI IND STRL 6.5 (GLOVE) ×3 IMPLANT
GLOVE BIOGEL PI IND STRL 8 (GLOVE) ×2 IMPLANT
GLOVE BIOGEL PI INDICATOR 6.5 (GLOVE) ×6
GLOVE BIOGEL PI INDICATOR 8 (GLOVE) ×4
GLOVE ECLIPSE 7.5 STRL STRAW (GLOVE) ×6 IMPLANT
GLOVE SURG SS PI 6.5 STRL IVOR (GLOVE) ×3 IMPLANT
GOWN STRL REUS W/ TWL LRG LVL3 (GOWN DISPOSABLE) ×1 IMPLANT
GOWN STRL REUS W/ TWL XL LVL3 (GOWN DISPOSABLE) ×2 IMPLANT
GOWN STRL REUS W/TWL LRG LVL3 (GOWN DISPOSABLE) ×2
GOWN STRL REUS W/TWL XL LVL3 (GOWN DISPOSABLE) ×4
KIT BASIN OR (CUSTOM PROCEDURE TRAY) ×3 IMPLANT
KIT ROOM TURNOVER OR (KITS) ×3 IMPLANT
NEEDLE 18GX1X1/2 (RX/OR ONLY) (NEEDLE) ×3 IMPLANT
PACK ARTHROSCOPY DSU (CUSTOM PROCEDURE TRAY) ×3 IMPLANT
PAD ARMBOARD 7.5X6 YLW CONV (MISCELLANEOUS) ×6 IMPLANT
PAD CAST 4YDX4 CTTN HI CHSV (CAST SUPPLIES) ×2 IMPLANT
PADDING CAST ABS 6INX4YD NS (CAST SUPPLIES) ×2
PADDING CAST ABS COTTON 6X4 NS (CAST SUPPLIES) ×1 IMPLANT
PADDING CAST COTTON 4X4 STRL (CAST SUPPLIES) ×4
SET ARTHROSCOPY TUBING (MISCELLANEOUS) ×2
SET ARTHROSCOPY TUBING LN (MISCELLANEOUS) ×1 IMPLANT
SPONGE GAUZE 4X4 12PLY STER LF (GAUZE/BANDAGES/DRESSINGS) ×3 IMPLANT
SUT ETHILON 4 0 PS 2 18 (SUTURE) ×3 IMPLANT
SYR 5ML LL (SYRINGE) ×3 IMPLANT
TOWEL OR 17X24 6PK STRL BLUE (TOWEL DISPOSABLE) ×3 IMPLANT
TOWEL OR 17X26 10 PK STRL BLUE (TOWEL DISPOSABLE) ×3 IMPLANT
WRAP KNEE MAXI GEL POST OP (GAUZE/BANDAGES/DRESSINGS) ×3 IMPLANT

## 2015-04-13 NOTE — Anesthesia Preprocedure Evaluation (Signed)
Anesthesia Evaluation  Patient identified by MRN, date of birth, ID band Patient awake    Reviewed: Allergy & Precautions, NPO status , Patient's Chart, lab work & pertinent test results  Airway Mallampati: II  TM Distance: >3 FB Neck ROM: Full    Dental  (+) Teeth Intact, Dental Advisory Given   Pulmonary    breath sounds clear to auscultation       Cardiovascular  Rhythm:Regular Rate:Normal     Neuro/Psych    GI/Hepatic   Endo/Other    Renal/GU      Musculoskeletal   Abdominal (+) + obese,   Peds  Hematology   Anesthesia Other Findings   Reproductive/Obstetrics                             Anesthesia Physical Anesthesia Plan  ASA: III  Anesthesia Plan: General   Post-op Pain Management:    Induction: Intravenous  Airway Management Planned: Oral ETT  Additional Equipment:   Intra-op Plan:   Post-operative Plan: Extubation in OR  Informed Consent: I have reviewed the patients History and Physical, chart, labs and discussed the procedure including the risks, benefits and alternatives for the proposed anesthesia with the patient or authorized representative who has indicated his/her understanding and acceptance.   Dental advisory given  Plan Discussed with: CRNA and Anesthesiologist  Anesthesia Plan Comments: (MMT R. Knee Obesity Deafness Hypertension  Plan GA with oral ETT  Kipp Broodavid Jmya Uliano)        Anesthesia Quick Evaluation

## 2015-04-13 NOTE — Anesthesia Procedure Notes (Signed)
Procedure Name: Intubation Date/Time: 04/13/2015 3:21 PM Performed by: Virgel GessHOLTZMAN, Mohit Zirbes LEFFEW Pre-anesthesia Checklist: Patient identified, Patient being monitored, Timeout performed, Emergency Drugs available and Suction available Patient Re-evaluated:Patient Re-evaluated prior to inductionOxygen Delivery Method: Circle System Utilized Preoxygenation: Pre-oxygenation with 100% oxygen Intubation Type: IV induction Laryngoscope Size: Mac and 3 Grade View: Grade I Tube type: Oral Tube size: 7.0 mm Number of attempts: 1 Airway Equipment and Method: Stylet Placement Confirmation: ETT inserted through vocal cords under direct vision,  positive ETCO2 and breath sounds checked- equal and bilateral Secured at: 21 cm Tube secured with: Tape Dental Injury: Teeth and Oropharynx as per pre-operative assessment

## 2015-04-13 NOTE — Progress Notes (Signed)
Discharge instructions given with Jodene NamKathy Fariss- interpreter for [employer] [facility] [employer] [facility].. Use of crutches given by Ethelene BrownsAnthony also with interpreter.

## 2015-04-13 NOTE — Discharge Instructions (Signed)
POST-OP KNEE ARTHROSCOPY INSTRUCTIONS  °Dr. John Graves/Jim Wanetta Funderburke PA-C ° °Pain °You will be expected to have a moderate amount of pain in the affected knee for approximately two weeks. However, the first two days will be the most severe pain. A prescription has been provided to take as needed for the pain. The pain can be reduced by applying ice packs to the knee for the first 1-2 weeks post surgery. Also, keeping the leg elevated on pillows will help alleviate the pain. If you develop any acute pain or swelling in your calf muscle, please call the doctor. ° °Activity °It is preferred that you stay at bed rest for approximately 24 hours. However, you may go to the bathroom with help. Weight bearing as tolerated. You may begin the knee exercises the day of surgery. Discontinue crutches as the knee pain resolves. ° °Dressing °Keep the dressing dry. If the ace bandage should wrinkle or roll up, this can be rewrapped to prevent ridges in the bandage. You may remove all dressings in 48 hours,  apply bandaids to each wound. You may shower on the 4th day after surgery but no tub bath. ° °Symptoms to report to your doctor °Extreme pain °Extreme swelling °Temperature above 101 degrees °Change in the feeling, color, or movement of your toes °Redness, heat, or swelling at your incision ° °Exercise °If is preferred that as soon as possible you try to do a straight leg raise without bending the knee and concentrate on bringing the heel of your foot off the bed up to approximately 45 degrees and hold for the count of 10 seconds. Repeat this at least 10 times three or four times per day. Additional exercises are provided below. ° °You are encouraged to bend the knee as tolerated. ° °Follow-Up °Call to schedule a follow-up appointment in 5-7 days. Our office # is [phone]. ° °POST-OP EXERCISES ° °Short Arc Quads ° °1. Lie on back with legs straight. Place towel roll under thigh, just above knee. °2. Tighten thigh muscles to  straighten knee and lift heel off bed. °3. Hold for slow count of five, then lower. °4. Do three sets of ten ° ° ° °Straight Leg Raises ° °1. Lie on back with operative leg straight and other leg bent. °2. Keeping operative leg completely straight, slowly lift operative leg so foot is 5 inches off bed. °3. Hold for slow count of five, then lower. °4. Do three sets of ten. ° ° ° °DO BOTH EXERCISES 2 TIMES A DAY ° °Ankle Pumps ° °Work/move the operative ankle and foot up and down 10 times every hour while awake. °

## 2015-04-13 NOTE — H&P (Signed)
  PREOPERATIVE H&P  Chief Complaint: right knee pain  HPI: [name] is a 54 y.o. female who presents for evaluation of right knee pain. It has been present for several months and has been worsening. She has failed conservative measures. Pain is rated as moderate.  Past Medical History  Diagnosis Date  . Deaf   . Chronic pain of right knee   . Anxiety   . Depression   . Arthritis     right hand and knee   Past Surgical History  Procedure Laterality Date  . Wisdom tooth extraction      patient denies  . Cholecystectomy    . Uterine fibroid surgery    . Hernia repair      ? umbicical  . Abdominal hysterectomy  2008   Social History   Social History  . Marital Status: Single    Spouse Name: N/A  . Number of Children: N/A  . Years of Education: N/A   Social History Main Topics  . Smoking status: Never Smoker   . Smokeless tobacco: None  . Alcohol Use: No  . Drug Use: No  . Sexual Activity: Not Asked   Other Topics Concern  . None   Social History Narrative   History reviewed. No pertinent family history. Allergies  Allergen Reactions  . Penicillins Rash   Prior to Admission medications   Medication Sig Start Date End Date Taking? Authorizing Provider  hydrOXYzine (ATARAX/VISTARIL) 25 MG tablet Take 1 tablet (25 mg total) by mouth 3 (three) times daily as needed for anxiety. Patient taking differently: Take 25 mg by mouth daily as needed for anxiety.  12/16/14   Shuvon B Rankin, NP  metoprolol tartrate (LOPRESSOR) 25 MG tablet Take 0.5 tablets (12.5 mg total) by mouth 2 (two) times daily. 12/16/14   Shuvon B Rankin, NP  sertraline (ZOLOFT) 100 MG tablet Take 1 tablet (100 mg total) by mouth daily. 12/16/14   Shuvon B Rankin, NP  traZODone (DESYREL) 50 MG tablet Take 1 tablet (50 mg total) by mouth at bedtime as needed for sleep. 12/16/14   Shuvon B Rankin, NP     Positive ROS: none  All other systems have been reviewed and were otherwise negative with the  exception of those mentioned in the HPI and as above.  Physical Exam: There were no vitals filed for this visit.  General: Alert, no acute distress Cardiovascular: No pedal edema Respiratory: No cyanosis, no use of accessory musculature GI: No organomegaly, abdomen is soft and non-tender Skin: No lesions in the area of chief complaint Neurologic: Sensation intact distally Psychiatric: Patient is competent for consent with normal mood and affect Lymphatic: No axillary or cervical lymphadenopathy  MUSCULOSKELETAL: right knee: Positive inhibition and grind.  Positive pain to range of motion.  Positive medial joint line tenderness.  No instability.  Trace effusion.  Assessment/Plan: medial meniscus tear right knee Plan for Procedure(s): RIGHT KNEE ARTHROSCOPY  The risks benefits and alternatives were discussed with the patient including but not limited to the risks of nonoperative treatment, versus surgical intervention including infection, bleeding, nerve injury, malunion, nonunion, hardware prominence, hardware failure, need for hardware removal, blood clots, cardiopulmonary complications, morbidity, mortality, among others, and they were willing to proceed.  Predicted outcome is good, although there will be at least a six to nine month expected recovery.  Asad Keeven L, MD 04/13/2015 2:40 AM

## 2015-04-13 NOTE — Brief Op Note (Signed)
04/13/2015  4:29 PM  PATIENT:  [name]  54 y.o. female  PRE-OPERATIVE DIAGNOSIS:  medial meniscus tear right knee  POST-OPERATIVE DIAGNOSIS:  medial meniscus tear right knee  PROCEDURE:  Procedure(s): RIGHT KNEE ARTHROSCOPY,Partial Medial Meniscectomy, Chondroplasty Medial Lateral, Medial Plica Excision (Right)  SURGEON:  Surgeon(s) and Role:    * Jodi GeraldsJohn Cailin Gebel, MD - Primary  PHYSICIAN ASSISTANT:   ASSISTANTS: bethune   ANESTHESIA:   general  EBL:  Total I/O In: 800 [I.V.:800] Out: 25 [Blood:25]  BLOOD ADMINISTERED:none  DRAINS: none   LOCAL MEDICATIONS USED:  MARCAINE     SPECIMEN:  No Specimen  DISPOSITION OF SPECIMEN:  N/A  COUNTS:  YES  TOURNIQUET:  * No tourniquets in log *  DICTATION: .Other Dictation: Dictation Number [id number]  PLAN OF CARE: Discharge to home after PACU  PATIENT DISPOSITION:  PACU - hemodynamically stable.   Delay start of Pharmacological VTE agent (>24hrs) due to surgical blood loss or risk of bleeding: no

## 2015-04-13 NOTE — Anesthesia Postprocedure Evaluation (Signed)
Anesthesia Post Note  Patient: [name]  Procedure(s) Performed: Procedure(s) (LRB): RIGHT KNEE ARTHROSCOPY,Partial Medial Meniscectomy, Chondroplasty Medial Lateral, Medial Plica Excision (Right)  Patient location during evaluation: PACU Anesthesia Type: General Level of consciousness: awake and awake and alert Pain management: pain level controlled Vital Signs Assessment: post-procedure vital signs reviewed and stable Respiratory status: spontaneous breathing Anesthetic complications: no    Last Vitals:  Filed Vitals:   04/13/15 1354 04/13/15 1623  BP: 160/96 118/71  Pulse: 87 83  Temp: 36.9 C 36.8 C  Resp: 18 15    Last Pain:  Filed Vitals:   04/13/15 1634  PainSc: 4         RLE Motor Response: Purposeful movement RLE Sensation: Full sensation      [name] COKER

## 2015-04-13 NOTE — Transfer of Care (Signed)
Immediate Anesthesia Transfer of Care Note  Patient: [name]  Procedure(s) Performed: Procedure(s): RIGHT KNEE ARTHROSCOPY,Partial Medial Meniscectomy, Chondroplasty Medial Lateral, Medial Plica Excision (Right)  Patient Location: PACU  Anesthesia Type:General  Level of Consciousness: awake, alert , patient cooperative and responds to stimulation  Airway & Oxygen Therapy: Patient Spontanous Breathing and Patient connected to nasal cannula oxygen  Post-op Assessment: Report given to RN, Post -op Vital signs reviewed and stable and Patient moving all extremities X 4  Post vital signs: Reviewed and stable  Last Vitals:  Filed Vitals:   04/13/15 1354 04/13/15 1623  BP: 160/96 118/71  Pulse: 87 83  Temp: 36.9 C 36.8 C  Resp: 18 15    Complications: No apparent anesthesia complications

## 2015-04-13 NOTE — Addendum Note (Signed)
Addendum  created 04/13/15 1826 by Margarita RanaAriel Leffew Lauralee Waters, CRNA   Modules edited: Anesthesia Events, Anesthesia Medication Administration, Narrator   Narrator:  Narrator: Event Log Edited

## 2015-04-13 NOTE — Progress Notes (Signed)
Orthopedic Tech Progress Note Patient Details:  April RuddleCheryl R Drake 1961/01/29 [id number]  Ortho Devices Type of Ortho Device: Crutches Ortho Device/Splint Interventions: Ordered, Adjustment   Jennye MoccasinHughes, [name] 04/13/2015, 5:45 PM

## 2015-04-14 ENCOUNTER — Encounter (HOSPITAL_COMMUNITY): Payer: Self-pay | Admitting: Orthopedic Surgery

## 2015-04-14 NOTE — Op Note (Signed)
NAMJoseph Art:  Drake, [name]              ACCOUNT NO.:  [id number]  MEDICAL RECORD NO.:  [id number]  LOCATION:  [facility]                         FACILITY:  [facility]  PHYSICIAN:  Harvie JuniorJohn L. Gurjot Brisco, M.D.   DATE OF BIRTH:  04-Oct-1960  DATE OF PROCEDURE:  04/13/2015 DATE OF DISCHARGE:  04/13/2015                              OPERATIVE REPORT   PREOPERATIVE DIAGNOSIS:  Medial meniscal tear.  POSTOPERATIVE DIAGNOSES: 1. Medial meniscal tear. 2. 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 4. Medial shelf plica.  PRINCIPAL PROCEDURES: 1. Partial posterior horn medial meniscectomy. 2. Chondroplasty of medial femoral condyle down to bleeding bone. 3. Chondroplasty of patellofemoral joint down to bleeding bone. 4. Medial shelf plica excision.  SURGEON:  Harvie JuniorJohn L. Denys Salinger, M.D.  ASSISTANT:  Marshia LyJames Bethune, P.A.  ANESTHESIA:  General.  BRIEF HISTORY:  Ms. [name] is a 54 year old female with long history of significant complaints of right knee pain.  She had been treated conservatively for period of time.  She had a previous MRI, which we not able to locate, but she said she was certain that, that showed a medial meniscal tear.  After failure of conservative care, medial side catching and locking and failure of injection therapy and activity modification, she was taken to the operating room for right knee arthroscopy.  DESCRIPTION OF PROCEDURE:  The patient was taken to the operating room. After adequate anesthesia was obtained with general anesthetic, the patient was placed supine on the operating table.  The right knee was then prepped and draped in usual sterile fashion.  Following this, routine arthroscopic examination of the knee revealed there was significant chondromalacia of the patellofemoral joint with grade 4 changes throughout the trochlea and anteromedial femoral condyle.  These were debrided back to a smooth and stable rim.  Undersurface of  the patella had some grade 2 and grade 3 changes, which were debrided. There was large medial shelf plica, which was draping and blocking access into the medial compartment, which was taken back to the rim of the capsule.  Attention was turned into the medial compartment.  There was a posterior horn medial meniscal tear, which was debrided back to a smooth and stable rim.  There was some grade 4 changes on the tibial plateau and grade 3 and 4 changes on the femoral condyle, which were debrided.  This was an over fairly significant area.  Once this was debrided, attention was turned to the ACL normal.  Attention was turned to the lateral side, which was essentially normal.  At this point, the knee was copiously and thoroughly lavaged and suctioned dry.  The arthroscopic portals were closed with the bandage.  A sterile compressive dressing was applied after 20 mL of 0.25% Marcaine to be instilled in the knee for postoperative anesthesia and the patient was then taken to the recovery room where she was noted to be in satisfactory condition.  Estimated blood loss for the procedure was minimal.     Harvie JuniorJohn L. Treyana Sturgell, M.D.     Ranae PlumberJLG/MEDQ  D:  04/13/2015  T:  04/14/2015  Job:  [id number]

## 2015-05-21 NOTE — ED Provider Notes (Signed)
CSN: [id number]     Arrival date & time 12/02/14  1621 History   First MD Initiated Contact with Patient 12/02/14 1655     Chief Complaint  Patient presents with  . Knee Pain  . stressed      (Consider location/radiation/quality/duration/timing/severity/associated sxs/prior Treatment) HPI  At bedside is pt's therapist, two language interpreters, a social worker  Patient has presented to the emergency department today with increased stress and a "funny" feeling since a recent psychiatry appointment approximately 8 days ago. She states she was given a new medicine for her depression, which she has had for the past 4 months since the death of her mother. She was put on Zoloft, the prescription she has brought into the ER today is sertraline 50 mg. Apparently 2 weeks ago due to increased stress, family altercations, the patient has not felt well, and discontinue taking her medicine at that time. She did resume it after her appointment. The patient has had difficulty sleeping of getting to sleep and staying to sleep, she wakes up every night between 3 and 4 AM and is unable to get back to sleep. She has decreased appetite, with some mild nausea. She has felt some tightness and heaviness in her chest area, described as a pressure. She has had multiple arguments and increased tension with her family members, and 2 days ago her symptoms increased , she notes increased visits to the bathroom with loose stools, no blood, and she has not had any vomiting episodes. She has had generalized muscle tension, but denies any sweats, chills, fever, abdominal pain.  Additional history is given by the patient's therapist and a Child psychotherapist who is present. They state in the last 4 months patient has been evaluated by psychiatry and diagnosed with major depressive disorder, which has come same time as the death of her mother. They state she does not carry a diagnosis of anxiety. There is family members of the patient  reportedly very unsupportive and problematic. The therapist and social worker have come in with her today, with concerns of elevated blood pressure and concerns with support issues at home. No one in the room and believes that the patient has been offered any anxiolytics, such as Vistaril, trazodone or benzos. She has not particularly dealt with anxiety in the past. Patient and chart review has been noted to have multiple episodes of elevated blood pressure systolic 150s to 409W, as well as heart rate in the low 100s. She denies taking any blood pressure medication. Patient is a poor historian, she is unable to answer specific questions related to her chest discomfort, but she does state that is ascending tension, pressure, and uneasiness, and appears to be related to high stress situations.      Past Medical History  Diagnosis Date  . Deaf   . Chronic pain of right knee   . Anxiety   . Depression   . Arthritis     right hand and knee   Past Surgical History  Procedure Laterality Date  . Wisdom tooth extraction      patient denies  . Cholecystectomy    . Uterine fibroid surgery    . Hernia repair      ? umbicical  . Abdominal hysterectomy  2008  . Knee arthroscopy Right 04/13/2015    Procedure: RIGHT KNEE ARTHROSCOPY,Partial Medial Meniscectomy, Chondroplasty Medial Lateral, Medial Plica Excision;  Surgeon: Jodi Geralds, MD;  Location: [facility] OR;  Service: Orthopedics;  Laterality: Right;   No family  history on file. Social History  Substance Use Topics  . Smoking status: Never Smoker   . Smokeless tobacco: None  . Alcohol Use: No   OB History    No data available     Review of Systems  All other systems reviewed and are negative.     Allergies  Penicillins  Home Medications   Prior to Admission medications   Medication Sig Start Date End Date Taking? Authorizing Provider  acetaminophen (TYLENOL) 500 MG tablet Take 500 mg by mouth every 6 (six) hours as needed for  mild pain.    Historical Provider, MD  hydrOXYzine (ATARAX/VISTARIL) 25 MG tablet Take 1 tablet (25 mg total) by mouth 3 (three) times daily as needed for anxiety. Patient taking differently: Take 25 mg by mouth daily as needed for anxiety.  12/16/14   Shuvon B Rankin, NP  metoprolol tartrate (LOPRESSOR) 25 MG tablet Take 0.5 tablets (12.5 mg total) by mouth 2 (two) times daily. 12/16/14   Shuvon B Rankin, NP  oxyCODONE-acetaminophen (PERCOCET/ROXICET) 5-325 MG tablet Take 1-2 tablets by mouth every 6 (six) hours as needed for severe pain. 04/13/15   Marshia LyJames Bethune, PA-C  sertraline (ZOLOFT) 100 MG tablet Take 1 tablet (100 mg total) by mouth daily. 12/16/14   Shuvon B Rankin, NP  traZODone (DESYREL) 50 MG tablet Take 1 tablet (50 mg total) by mouth at bedtime as needed for sleep. 12/16/14   Shuvon B Rankin, NP   BP 130/67 mmHg  Pulse 89  Temp(Src) 97.8 F (36.6 C) (Oral)  Resp 18  SpO2 96% Physical Exam  Constitutional: She is oriented to person, place, and time. She appears well-developed and well-nourished. No distress.  HENT:  Head: Normocephalic and atraumatic.  Nose: Nose normal.  Mouth/Throat: Oropharynx is clear and moist. No oropharyngeal exudate.  Eyes: Conjunctivae and EOM are normal. Pupils are equal, round, and reactive to light. Right eye exhibits no discharge. Left eye exhibits no discharge. No scleral icterus.  Neck: Normal range of motion. No JVD present. No tracheal deviation present. No thyromegaly present.  Cardiovascular: Normal rate, regular rhythm, normal heart sounds and intact distal pulses.  Exam reveals no gallop and no friction rub.   No murmur heard. Pulmonary/Chest: Effort normal and breath sounds normal. No respiratory distress. She has no wheezes. She has no rales. She exhibits tenderness.  Central chest wall ttp  Abdominal: Soft. Bowel sounds are normal. She exhibits no distension and no mass. There is no tenderness. There is no rebound and no guarding.   Musculoskeletal: Normal range of motion. She exhibits no edema or tenderness.  Lymphadenopathy:    She has no cervical adenopathy.  Neurological: She is alert and oriented to person, place, and time. She has normal reflexes. No cranial nerve deficit. She exhibits normal muscle tone. Coordination normal.  Skin: Skin is warm and dry. No rash noted. She is not diaphoretic. No erythema. No pallor.  Psychiatric: She has a normal mood and affect. Her behavior is normal. Judgment and thought content normal.  Nursing note and vitals reviewed.   ED Course  Procedures (including critical care time) Labs Review Labs Reviewed  COMPREHENSIVE METABOLIC PANEL - Abnormal; Notable for the following:    Glucose, Bld 111 (*)    Creatinine, Ser 1.24 (*)    AST 11 (*)    ALT 12 (*)    Total Bilirubin 0.2 (*)    GFR calc non Af Amer 49 (*)    GFR calc Af Amer 56 (*)  All other components within normal limits  LIPASE, BLOOD - Abnormal; Notable for the following:    Lipase 16 (*)    All other components within normal limits  URINALYSIS, ROUTINE W REFLEX MICROSCOPIC (NOT AT [facility]) - Abnormal; Notable for the following:    APPearance CLOUDY (*)    All other components within normal limits  CBC WITH DIFFERENTIAL/PLATELET  I-STAT TROPOININ, ED    Imaging Review No results found. I have personally reviewed and evaluated these images and lab results as part of my medical decision-making.   EKG Interpretation   Date/Time:  Thursday December 02 2014 18:03:45 EDT Ventricular Rate:  97 PR Interval:  119 QRS Duration: 78 QT Interval:  454 QTC Calculation: 577 R Axis:   15 Text Interpretation:  Sinus rhythm Borderline short PR interval Low  voltage, precordial leads Borderline T abnormalities, anterior leads  Prolonged QT interval Artifact When compared with ECG of 07/30/2006 QT has  lengthened Confirmed by [facility]  MD, Nicholos Johns [phone]) on 12/02/2014 8:49:24  PM      MDM   Patient with multiple  vague complaints, decreased appetite, nausea type symptoms, chest pressure, all associated with increased tension at home with her family. Patient was most concerned about her blood pressure which was systolic 165 upon arrival to the ER. Patient has frequently high readings at her doctor's office visits however this was slightly higher than normal for her. Since being in the ER without any treatment her blood pressure is now 130 systolic.  History is hard to obtain, chest pain workup initiated, suspect this is likely related to situational stress on top of recent major depressive disorder which was diagnosed in the last 4 months, patient has recently started Zoloft with intermittent compliance. Suspect during this time of increased tension and frequent altercations, patient may benefit from treatment for her situational anxiety. I have encouraged follow-up with her established psychiatrist/psychologist.   Initial troponin is negative, no signs of infection or abdominal pathology. I have updated the patient of her findings, her social worker is present in the room as well as sign language interpreter.  And encouraged follow-up with her therapist and available psych for possible other treatments as situational anxiety and for therapy to help her increase her coping mechanisms. Patient states she has no SI no HI no A-V H, she has multiple resources to reach out to an case of increased anxiety or if she does not feel safe in her home with her family.   Filed Vitals:   12/02/14 1826 12/02/14 1830 12/02/14 1915 12/02/14 2014  BP: 145/75 139/69 130/82 130/67  Pulse: 92 90 79 89  Temp:      TempSrc:      Resp: 20 15 21 18   SpO2: 94% 94% 97% 96%   Medications  sodium chloride 0.9 % bolus 1,000 mL (0 mLs Intravenous Stopped 12/02/14 2012)  ondansetron (ZOFRAN) injection 4 mg (4 mg Intravenous Given 12/02/14 1825)  sodium chloride 0.9 % bolus 1,000 mL (0 mLs  Intravenous Stopped 12/02/14 2048)   Patient had multiple questions about antianxiety medications, they're gone over at great length. Both the pt, her therapist, pt social worker have multiple questions. Over 45 minutes spent in exam room with extensive questions.         Final diagnoses:  Anxiety  Nausea        Danelle Berry, PA-C 05/21/15 0134  Samuel Jester, DO 05/22/15 1914

## 2018-04-17 ENCOUNTER — Emergency Department (HOSPITAL_COMMUNITY)
Admission: EM | Admit: 2018-04-17 | Discharge: 2018-04-18 | Disposition: A | Discharge status: Home / Self Care | Payer: Medicare Other | Attending: Emergency Medicine | Admitting: Emergency Medicine

## 2018-04-17 ENCOUNTER — Other Ambulatory Visit: Payer: Self-pay

## 2018-04-17 DIAGNOSIS — F329 Major depressive disorder, single episode, unspecified: Secondary | ICD-10-CM | POA: Diagnosis present

## 2018-04-17 DIAGNOSIS — R609 Edema, unspecified: Secondary | ICD-10-CM

## 2018-04-17 DIAGNOSIS — F32A Depression, unspecified: Secondary | ICD-10-CM

## 2018-04-17 DIAGNOSIS — Z79899 Other long term (current) drug therapy: Secondary | ICD-10-CM | POA: Diagnosis not present

## 2018-04-17 DIAGNOSIS — Z5900 Homelessness unspecified: Secondary | ICD-10-CM

## 2018-04-17 DIAGNOSIS — R600 Localized edema: Secondary | ICD-10-CM | POA: Insufficient documentation

## 2018-04-17 NOTE — ED Notes (Signed)
Per daughter pt is not having thoughts of harming herself

## 2018-04-17 NOTE — ED Triage Notes (Addendum)
Pt to ED by [facility] with c/o coldness, depression, dizziness, chills, and hypertension. Pt is deaf and stays in her car when she feels upset per daughter. Daughter called [facility] and explained that pt prefers to live in shelter/group home but when going through episodes of depression stays in her car. Per [facility] picked up pt in urine infested broken down car. Pt uses sign language to communicate. Pt has been taking depression medicine but hasn't been on BP meds for a month. Pt states that her left leg is swollen and in pain from not having her BP medicine. Pt states she is very sad, anxious, and weak and needs a place to stay/ lay down. Pt denies SI or HI. Pt is A&Ox4.

## 2018-04-18 ENCOUNTER — Encounter (HOSPITAL_COMMUNITY): Payer: Self-pay

## 2018-04-18 ENCOUNTER — Emergency Department (HOSPITAL_COMMUNITY): Payer: Medicare Other

## 2018-04-18 DIAGNOSIS — F329 Major depressive disorder, single episode, unspecified: Secondary | ICD-10-CM | POA: Diagnosis not present

## 2018-04-18 LAB — BASIC METABOLIC PANEL
ANION GAP: 10 (ref 5–15)
BUN: 15 mg/dL (ref 6–20)
CALCIUM: 9 mg/dL (ref 8.9–10.3)
CO2: 29 mmol/L (ref 22–32)
Chloride: 101 mmol/L (ref 98–111)
Creatinine, Ser: 0.97 mg/dL (ref 0.44–1.00)
GFR calc Af Amer: >60 mL/min (ref >60)
GLUCOSE: 185 mg/dL — AB (ref 70–99)
POTASSIUM: 3.7 mmol/L (ref 3.5–5.1)
Sodium: 140 mmol/L (ref 135–145)

## 2018-04-18 LAB — I-STAT TROPONIN, ED: TROPONIN I, POC: 0 ng/mL (ref 0.00–0.08)

## 2018-04-18 LAB — CBC WITH DIFFERENTIAL/PLATELET
ABS IMMATURE GRANULOCYTES: 0.03 10*3/uL (ref 0.00–0.07)
BASOS ABS: 0 10*3/uL (ref 0.0–0.1)
BASOS PCT: 0 %
EOS ABS: 0.1 10*3/uL (ref 0.0–0.5)
Eosinophils Relative: 1 %
HCT: 40.6 % (ref 36.0–46.0)
Hemoglobin: 12.1 g/dL (ref 12.0–15.0)
Immature Granulocytes: 0 %
LYMPHS ABS: 1.4 10*3/uL (ref 0.7–4.0)
Lymphocytes Relative: 21 %
MCH: 27.1 pg (ref 26.0–34.0)
MCHC: 29.8 g/dL — ABNORMAL LOW (ref 30.0–36.0)
MCV: 91 fL (ref 80.0–100.0)
MONOS PCT: 10 %
Monocytes Absolute: 0.7 10*3/uL (ref 0.1–1.0)
NEUTROS ABS: 4.6 10*3/uL (ref 1.7–7.7)
NEUTROS PCT: 68 %
NRBC: 0 % (ref 0.0–0.2)
PLATELETS: 249 10*3/uL (ref 150–400)
RBC: 4.46 MIL/uL (ref 3.87–5.11)
RDW: 14.8 % (ref 11.5–15.5)
WBC: 6.8 10*3/uL (ref 4.0–10.5)

## 2018-04-18 LAB — BRAIN NATRIURETIC PEPTIDE: B NATRIURETIC PEPTIDE 5: 15 pg/mL (ref 0.0–100.0)

## 2018-04-18 MED ORDER — METOPROLOL TARTRATE 25 MG PO TABS: 12.5000 mg | ORAL_TABLET | Freq: Two times a day (BID) | ORAL | 0 refills | Status: DC

## 2018-04-18 MED ORDER — SERTRALINE HCL 50 MG PO TABS
100.0000 mg | ORAL_TABLET | Freq: Every day | ORAL | Status: DC
  Administered 2018-04-18: 100 mg via ORAL
  Filled 2018-04-18: qty 2

## 2018-04-18 MED ORDER — FUROSEMIDE 10 MG/ML IJ SOLN
40.0000 mg | Freq: Once | INTRAMUSCULAR | Status: AC
  Administered 2018-04-18: 40 mg via INTRAVENOUS
  Filled 2018-04-18: qty 4

## 2018-04-18 MED ORDER — ACETAMINOPHEN 500 MG PO TABS: 500.0000 mg | ORAL_TABLET | Freq: Four times a day (QID) | ORAL | Status: DC | PRN

## 2018-04-18 MED ORDER — FUROSEMIDE 40 MG PO TABS: 40.0000 mg | ORAL_TABLET | Freq: Every day | ORAL | 0 refills | Status: DC

## 2018-04-18 MED ORDER — METOPROLOL TARTRATE 25 MG PO TABS
12.5000 mg | ORAL_TABLET | Freq: Two times a day (BID) | ORAL | Status: DC
  Administered 2018-04-18: 12.5 mg via ORAL
  Filled 2018-04-18: qty 1

## 2018-04-18 NOTE — ED Provider Notes (Signed)
The patient was discharged earlier, by Dr. Rodena MedinMessick.  She now states that she needs a note saying that she was here in the emergency department, and would like her prescriptions sent to a different pharmacy than Dr. Rodena MedinMessick sent them to, earlier.   Mancel BaleWentz, April Bonaventure, MD 04/18/18 [phone]

## 2018-04-18 NOTE — ED Notes (Signed)
Bed: WA31 Expected date:  Expected time:  Means of arrival:  Comments: 

## 2018-04-18 NOTE — ED Provider Notes (Signed)
TIME SEEN: 1:30 AM  CHIEF COMPLAINT: Multiple complaints  HPI: Patient is a 57 year old female with history of depression who is also deaf who presents to the emergency department with multiple complaints.  She states that she has been feeling depressed.  States that the holidays are always hard for her especially since her mother died 3 years ago on 04-21-2023.  She denies any SI, HI or hallucinations.  She does have an outpatient therapist that she is seeing.  She also reports feeling like she has had a cold.  Has had some cough with yellow sputum production, nasal congestion.  She has felt chilled but no known fevers.  States symptoms ongoing for a couple of days.  She has had sick contacts that she lives in a homeless shelter.  No sore throat or ear pain.  No headache.  No neck pain or neck stiffness.  Has not had a flu shot this year.  Also states she has felt very fatigued for the past couple weeks and had increased swelling in both of her legs.  No history of CHF.  Not on diuretics.  No chest pain or shortness of breath.  States she feels like she needs to be able to rest for this to get better but she has to leave the shelter every morning at 7 AM.  States she just feels exhausted.  ROS: See HPI Constitutional: no fever  Eyes: no drainage  ENT: no runny nose   Cardiovascular:  no chest pain  Resp: no SOB  GI: no vomiting GU: no dysuria Integumentary: no rash  Allergy: no hives  Musculoskeletal:  leg swelling  Neurological: no slurred speech ROS otherwise negative  PAST MEDICAL HISTORY/PAST SURGICAL HISTORY:  Past Medical History:  Diagnosis Date  . Anxiety   . Arthritis    right hand and knee  . Chronic pain of right knee   . Deaf   . Depression     MEDICATIONS:  Prior to Admission medications   Medication Sig Start Date End Date Taking? Authorizing Provider  acetaminophen (TYLENOL) 500 MG tablet Take 500 mg by mouth every 6 (six) hours as needed for mild pain.   Yes  [provider]  metoprolol tartrate (LOPRESSOR) 25 MG tablet Take 0.5 tablets (12.5 mg total) by mouth 2 (two) times daily. 12/16/14  Yes Rankin, Shuvon B, NP  sertraline (ZOLOFT) 100 MG tablet Take 1 tablet (100 mg total) by mouth daily. 12/16/14  Yes Rankin, Shuvon B, NP  hydrOXYzine (ATARAX/VISTARIL) 25 MG tablet Take 1 tablet (25 mg total) by mouth 3 (three) times daily as needed for anxiety. Patient not taking: Reported on 04/18/2018 12/16/14   Rankin, Shuvon B, NP  oxyCODONE-acetaminophen (PERCOCET/ROXICET) 5-325 MG tablet Take 1-2 tablets by mouth every 6 (six) hours as needed for severe pain. Patient not taking: Reported on 04/18/2018 04/13/15   Marshia Ly, PA-C  traZODone (DESYREL) 50 MG tablet Take 1 tablet (50 mg total) by mouth at bedtime as needed for sleep. Patient not taking: Reported on 04/18/2018 12/16/14   Rankin, Denice Bors B, NP    ALLERGIES:  Allergies  Allergen Reactions  . Penicillins Rash    Has patient had a PCN reaction causing immediate rash, facial/tongue/throat swelling, SOB or lightheadedness with hypotension: Unknown Has patient had a PCN reaction causing severe rash involving mucus membranes or skin necrosis: Unknown Has patient had a PCN reaction that required hospitalization: Unknown Has patient had a PCN reaction occurring within the last 10 years: No If  all of the above answers are "NO", then may proceed with Cephalosporin use.     SOCIAL HISTORY:  Social History   Tobacco Use  . Smoking status: Never Smoker  Substance Use Topics  . Alcohol use: No    FAMILY HISTORY: No family history on file.  EXAM: BP 132/73 (BP Location: Left Arm)   Pulse 89   Resp 18   Ht 5\' 6"  (1.676 m)   Wt (!) 147 kg   SpO2 100%   BMI 52.31 kg/m Temp 98.4 F Oral CONSTITUTIONAL: Alert and oriented and responds appropriately to questions. Well-appearing; well-nourished HEAD: Normocephalic EYES: Conjunctivae clear, pupils appear equal, EOMI ENT: normal nose; moist  mucous membranes NECK: Supple, no meningismus, no nuchal rigidity, no LAD  CARD: RRR; S1 and S2 appreciated; no murmurs, no clicks, no rubs, no gallops RESP: Normal chest excursion without splinting or tachypnea; breath sounds clear and equal bilaterally; no wheezes, no rhonchi, no rales, no hypoxia or respiratory distress, speaking full sentences ABD/GI: Normal bowel sounds; non-distended; soft, non-tender, no rebound, no guarding, no peritoneal signs, no hepatosplenomegaly BACK:  The back appears normal and is non-tender to palpation, there is no CVA tenderness EXT: Normal ROM in all joints; non-tender to palpation; patient has significant lower extremity edema to the level of the knee bilaterally, 2+ DP pulses bilaterally, no redness or warmth; normal capillary refill; no cyanosis, no calf tenderness or swelling    SKIN: Normal color for age and race; warm; no rash NEURO: Moves all extremities equally PSYCH: The patient's mood and manner are appropriate. Grooming and personal hygiene are appropriate.  MEDICAL DECISION MAKING: Patient here with multiple complaints.  She is complaining of feeling depressed but does have outpatient follow-up.  She has no concerning psychiatric safety concern at this time and I do not feel she needs emergent TTS evaluation.  She agrees with this plan.  She is on Zoloft.    Also complaining of URI symptoms, likely viral in nature but will obtain chest x-ray to rule out pneumonia.  Also complaining of lower extremity swelling for the past several weeks.  Does not use compression hose or diuretics.  Will obtain labs today including BNP.  Will give IV Lasix for symptomatic relief.  Will also provide patient with a note if she is discharged stating that she cannot rest at the homeless shelter which may give her some relief as well.  ED PROGRESS: Patient's lab work unremarkable.  Negative troponin and normal BNP.  Her chest x-ray shows some cardiomegaly and vascular  congestion but no edema.  Given these findings in the setting of peripheral edema as well I have recommended close cardiology follow-up for outpatient echocardiogram.  We will give her Lasix for the next week and have recommended elevation of her legs, compression hose.  We will refill her metoprolol as well.  Patient states she needs help getting back to the homeless shelter and needs help with outpatient resources given her living and financial situation.  Will consult social work in the morning to help patient but anticipate discharge.   At this time, I do not feel there is any life-threatening condition present. I have reviewed and discussed all results (EKG, imaging, lab, urine as appropriate) and exam findings with patient/family. I have reviewed nursing notes and appropriate previous records.  I feel the patient is safe to be discharged home without further emergent workup and can continue workup as an outpatient as needed. Discussed usual and customary return precautions. Patient/family  verbalize understanding and are comfortable with this plan.  Outpatient follow-up has been provided as needed. All questions have been answered.     EKG Interpretation  Date/Time:  Friday April 18 2018 02:12:17 EST Ventricular Rate:  86 PR Interval:    QRS Duration: 94 QT Interval:  382 QTC Calculation: 457 R Axis:   49 Text Interpretation:  Sinus rhythm Borderline T wave abnormalities No significant change since last tracing Confirmed by Mikenna Bunkley, Baxter HireKristen [phone]) on 04/18/2018 2:14:29 AM          [place] Pandey, Layla MawKristen N, DO 04/18/18 [id number]

## 2018-04-18 NOTE — ED Notes (Signed)
[place] [employer] [facility]. In a rush forgot to sign signature pad.  Pt. Had no question about discharge. Received a work note, AVS and voucher for taxi.

## 2018-04-18 NOTE — ED Notes (Signed)
Patient transported to X-ray 

## 2018-04-18 NOTE — ED Notes (Signed)
Bed: WLPT1 Expected date:  Expected time:  Means of arrival:  Comments: 

## 2018-04-18 NOTE — Discharge Instructions (Addendum)
I recommend that you take Lasix for the next week to help with your lower extremity swelling.  You may wear compression hose and keep your legs elevated while at rest which may help with your lower extremity swelling.  Please follow-up with cardiology as an outpatient for an echocardiogram.  I recommend that for the next 2 weeks the shelter allow you to stay in the morning and rest so that you may keep your legs elevated to help with your lower extremity swelling and fatigue.  I recommend close follow-up with your therapist as needed for your worsening depression.  If you have any symptoms of wanting to hurt yourself or anyone else, please return to the emergency department for treatment.

## 2018-04-18 NOTE — ED Notes (Signed)
Bed: [id number] Expected date:  Expected time:  Means of arrival:  Comments: Room 1

## 2018-04-18 NOTE — ED Notes (Signed)
Pt. Daughter phone number - [phone]

## 2018-04-18 NOTE — Progress Notes (Addendum)
CSW aware of consult as patient is homeless and is needing additional assistance. CSW spoke with patient at bedside, with the assistance of Wall[employer]E, who expressed frustration with not knowing where she'll be each night. Per patient, [facility] and she is unsure if she can return back there once the remodeling is done. Patient reports she just wants to find a place she can settle down in. Per patient, she has applied to many apartments but has been denied from them all. Patient states she also found an ALF that told her they had a 7-year wait list. Patient states she is active with [facility] and has a therapist named Weyman CroonStan. Patient requesting CSW reach [facility] and let him know she is here. Patient also requested that CSW reach out to [facility] Director [facility] and let them know that patient was here. CSW has spoken to [facility]im[facility] Need[facility]Michael [facility] and updated him of patient's status here at the hospital. CSW to provide patient with a list of homeless shelters in the area, a list of housing programs and crisis assistance programs. CSW also to help assist patient with transportation.   3:25pm- CSW spoke to patient who reports she would go to a motel for the next couple of days and then [facility] Per [facility] [facility] [place] CSW called to verify they had available rooms. CSW has confirmed and has provided RN with taxi voucher to get to motel to provide to patient once she is discharged. Patient agreeable. RN to call when patient is ready. CSW to leave handoff for 2nd shift CSW.   Archie BalboaMackenzie Irwin, LCSWA  Clinical Social Work Department  [facility]  [phone]

## 2018-04-29 ENCOUNTER — Emergency Department (HOSPITAL_COMMUNITY)
Admission: EM | Admit: 2018-04-29 | Discharge: 2018-04-29 | Disposition: A | Discharge status: Home / Self Care | Payer: Medicare Other | Attending: Emergency Medicine | Admitting: Emergency Medicine

## 2018-04-29 ENCOUNTER — Encounter (HOSPITAL_COMMUNITY): Payer: Self-pay | Admitting: Emergency Medicine

## 2018-04-29 DIAGNOSIS — Z5900 Homelessness unspecified: Secondary | ICD-10-CM

## 2018-04-29 DIAGNOSIS — Z79899 Other long term (current) drug therapy: Secondary | ICD-10-CM | POA: Insufficient documentation

## 2018-04-29 DIAGNOSIS — R2681 Unsteadiness on feet: Secondary | ICD-10-CM | POA: Insufficient documentation

## 2018-04-29 DIAGNOSIS — I10 Essential (primary) hypertension: Secondary | ICD-10-CM | POA: Insufficient documentation

## 2018-04-29 DIAGNOSIS — F419 Anxiety disorder, unspecified: Secondary | ICD-10-CM

## 2018-04-29 HISTORY — DX: Essential (primary) hypertension: I10

## 2018-04-29 MED ORDER — SERTRALINE HCL 50 MG PO TABS: 50.0000 mg | ORAL_TABLET | Freq: Every day | ORAL | 0 refills | Status: AC

## 2018-04-29 MED ORDER — SERTRALINE HCL 50 MG PO TABS
100.0000 mg | ORAL_TABLET | Freq: Every day | ORAL | Status: DC
  Administered 2018-04-29: 100 mg via ORAL
  Filled 2018-04-29: qty 2

## 2018-04-29 NOTE — Progress Notes (Signed)
Consult request has been received. CSW attempting to follow up at present time.  CSW reviewed notes and sees pt was in the ED on 12/5-12/6.  Per notes:  Pt. Daughter phone number - [phone]  CS[facility] whether [facility] there for the pt.    CSW called [facility] and spoke to [place] and was told the pt can stay in the [facility] and that, per [place], the [employer] will be up for the next two nights at the [facility] [employer] signifying that the [facility] will act as an emergency shelter for the next two nights due to the predicted freezing weather.  CSW will relay the above to the pt and provide a taxi voucher to the pt to get to the [facility] for tonight and explain via [employer]Wally" interpreter that the [facility] will be open the two nights following the [facility].    CSW will also explain the PATH program at the [facility] and who to ask for at the [facility] program to be connected to in order to receive needed medications.  The P.A.T.H. team conducts outreach to clients to see patient's meet criteria for severe and persistent mental illness (or co- ocurring disorders) and chronic homelessness and once they are enrolled then the PATH team conducts assertive case management with the patient by connecting them to mental health, primary care, help with clothing, food, Medicaid applications, etc.  The P.A.T.H. team also conducts housing assessments and submits them to [facility] and advocates for the patient to receive vouchers for permanent housing and in the meantime provides transport or provide bus passes when appropriate.   Dorothe PeaJonathan F. Isadore Palecek, LCSW, LCAS, CSI Clinical Social Worker Ph: [phone]

## 2018-04-29 NOTE — Progress Notes (Signed)
CSW called the dispatcher and instructed the dispatcher to instruct the taxi driver to press the button on the shelter gate/door so that the pt will be allowed in.  Dispatcher voiced understanding.  [facility] is Tommi EmeryMichael Pearson and can be contacted by the RN CM for the delivery of a walker/rollator.  Please reconsult if future social work needs arise.  CSW signing off, as social work intervention is no longer needed.  April PeaJonathan F. Kila Godina, LCSW, LCAS, CSI Clinical Social Worker Ph: [phone]

## 2018-04-29 NOTE — Discharge Instructions (Signed)
You were seen in the ER for anxiety.  I have refilled your prescription for Zoloft.  Social work met with you today and give you resources.  I have placed an order for outpatient evaluation of your gait.  There is an order for an evaluation to determine if you need a rolling walker.

## 2018-04-29 NOTE — Progress Notes (Signed)
CSW went through all the written instructions with the patient via "[employer]ally" interpreter twice and the first time the patient protested she couldn't stand an an order was placed by the EPD for a walker and the second time the pt protested that tomorrow it "would be too cold" and asked if she could pay for a taxi which the CSW confirmed she could.  CSW informed pt her daughter had been informed per pt's request that her daughter be informed.  Pt voiced understanding and was appreciative and thanked the CSW.  RN to call taxi when pt is ready for D/C.  RN aware.  Please reconsult if future social work needs arise.  CSW signing off, as social work intervention is no longer needed.  Dorothe PeaJonathan F. Matheau Orona, LCSW, LCAS, CSI Clinical Social Worker Ph: [phone]

## 2018-04-29 NOTE — ED Triage Notes (Signed)
Pt brought in by [employer] from hotel where patient was getting kicked out due to no money to pay. Pt having anxiety and depression due to shelters being full, car being broke down and no where to stay. Pt reports she cant stay with any family. Denies SI or HI. Pt out of her depression medications and cant afford to get more. Pt reports crying bc has no where to go.

## 2018-04-29 NOTE — ED Notes (Signed)
Debbe Balesmanda Roye-Collins, pt's, daughter, [phone]

## 2018-04-29 NOTE — ED Provider Notes (Addendum)
[facility]-EMERGENCY DEPT Provider Note   CSN: [id number] Arrival date & time: 04/29/18  1508     History   Chief Complaint Chief Complaint  Patient presents with  . Anxiety  . Homeless    HPI [name] is a 57 y.o. female is here for evaluation of "anxiety".  Patient is deaf and history is obtained via sign language interpreter.  Patient reports she ran out of her Zoloft 2 days ago.  She has been unable to refill this because she has no money for it.  She reports frequent crying, feeling really frustrated.  States that her car recently broke down and has no transportation.  She could not stay at the motel any longer and now is homeless.  She does not have a place to stay.  [facility] but they closed at 3 PM and now has nowhere to go.  States the holidays are typically harder for her because her mother died around the holidays 3 years ago.  Her goal is "to find housing".  She reports remote history of suicidal ideations more than 3 years ago but adamantly denies SI, HI, auditory or visual hallucinations currently.  HPI  Past Medical History:  Diagnosis Date  . Anxiety   . Arthritis    right hand and knee  . Chronic pain of right knee   . Deaf   . Depression   . Hypertension     Patient Active Problem List   Diagnosis Date Noted  . Major depressive disorder, recurrent, severe without psychotic features (HCC)   . Recurrent major depression-severe (HCC) 12/09/2014  . Difficulty hearing 03/02/2014    Past Surgical History:  Procedure Laterality Date  . ABDOMINAL HYSTERECTOMY  2008  . CHOLECYSTECTOMY    . HERNIA REPAIR     ? umbicical  . KNEE ARTHROSCOPY Right 04/13/2015   Procedure: RIGHT KNEE ARTHROSCOPY,Partial Medial Meniscectomy, Chondroplasty Medial Lateral, Medial Plica Excision;  Surgeon: Jodi GeraldsJohn Graves, MD;  Location: [facility] OR;  Service: Orthopedics;  Laterality: Right;  . UTERINE FIBROID SURGERY    . WISDOM TOOTH  EXTRACTION     patient denies     OB History   No obstetric history on file.      Home Medications    Prior to Admission medications   Medication Sig Start Date End Date Taking? Authorizing Provider  acetaminophen (TYLENOL) 500 MG tablet Take 500 mg by mouth every 6 (six) hours as needed for mild pain.    [provider]  furosemide (LASIX) 40 MG tablet Take 1 tablet (40 mg total) by mouth daily. 04/18/18   Mancel BaleWentz, Elliott, MD  metoprolol tartrate (LOPRESSOR) 25 MG tablet Take 0.5 tablets (12.5 mg total) by mouth 2 (two) times daily. 04/18/18   Mancel BaleWentz, Elliott, MD  sertraline (ZOLOFT) 50 MG tablet Take 1 tablet (50 mg total) by mouth daily. 04/29/18 05/29/18  Liberty HandyGibbons, Claudia J, PA-C    Family History No family history on file.  Social History Social History   Tobacco Use  . Smoking status: Never Smoker  . Smokeless tobacco: Never Used  Substance Use Topics  . Alcohol use: No  . Drug use: No     Allergies   Penicillins   Review of Systems Review of Systems  Psychiatric/Behavioral: The patient is nervous/anxious.   All other systems reviewed and are negative.    Physical Exam Updated Vital Signs BP 139/72   Pulse 71   Temp 98.2 F (36.8  C) (Oral)   Resp 16   SpO2 97%   Physical Exam Vitals signs and nursing note reviewed.  Constitutional:      General: She is not in acute distress.    Appearance: She is well-developed.     Comments: NAD. Morbidly obese.   HENT:     Head: Normocephalic and atraumatic.     Right Ear: External ear normal.     Left Ear: External ear normal.     Nose: Nose normal.  Eyes:     General: No scleral icterus.    Conjunctiva/sclera: Conjunctivae normal.  Neck:     Musculoskeletal: Normal range of motion and neck supple.  Cardiovascular:     Rate and Rhythm: Normal rate and regular rhythm.     Heart sounds: Normal heart sounds. No murmur.  Pulmonary:     Effort: Pulmonary effort is normal.     Breath sounds: Normal  breath sounds. No wheezing.  Musculoskeletal: Normal range of motion.        General: No deformity.     Comments: Pt sits up on bed, transfers into/out bed without assistance using cane.  She can take 10 steps around hall without slow, cautious, but steady gait. RN at bedside.  Skin:    General: Skin is warm and dry.     Capillary Refill: Capillary refill takes less than 2 seconds.  Neurological:     Mental Status: She is alert and oriented to person, place, and time.  Psychiatric:        Mood and Affect: Affect is tearful.        Behavior: Behavior normal.        Thought Content: Thought content normal.        Judgment: Judgment normal.     Comments: Teary-eyed.  Pleasant.  Denies SI, HI or AVH.      ED Treatments / Results  Labs (all labs ordered are listed, but only abnormal results are displayed) Labs Reviewed - No data to display  EKG None  Radiology No results found.  Procedures Procedures (including critical care time)  Medications Ordered in ED Medications  sertraline (ZOLOFT) tablet 100 mg (100 mg Oral Given 04/29/18 2235)     Initial Impression / Assessment and Plan / ED Course  I have reviewed the triage vital signs and the nursing notes.  Pertinent labs & imaging results that were available during my care of the patient were reviewed by me and considered in my medical decision making (see chart for details).  Clinical Course as of Apr 29 2348  Tue Apr 29, 2018  2251 I personally ambulated patient   [CG]    Clinical Course User Index [CG] Liberty Handy, PA-C    We will give patient 1 dose of her Zoloft here.  I do not see indication for formal/emergent TTS evaluation she denies SI, HI, AVH.  She is teary-eyed but otherwise her exam is reassuring.  I spoke with Christiane Ha with social work who will meet with patient.  Case management consult ordered to see if they can provide any assistance with medication refills.  2150: I spoke to [place] again.   [facility] night only, she has been given resources following that with [facility] after that.  Christiane Ha recommended face to face order and rollator order as pt reports unsteady gait, frequent falls. This was ordered today.  Case management not available tonight.  Patient has been  given resources and information to follow-up with [facility] to obtain her medicines.  Taxi voucher given to her. Pt appreciate of resources given today.  Final Clinical Impressions(s) / ED Diagnoses   Final diagnoses:  Anxiety  Homelessness  Unsteady gait    ED Discharge Orders         Ordered    For home use only DME 4 wheeled rolling walker with seat     04/29/18 2253    Home Health     04/29/18 2253    Face-to-face encounter (required for Medicare/Medicaid patients)    Comments:  I Liberty Handy certify that this patient is under my care and that I, or a nurse practitioner or physician's assistant working with me, had a face-to-face encounter that meets the physician face-to-face encounter requirements with this patient on 04/29/2018. The encounter with the patient was in whole, or in part for the following medical condition(s) which is the primary reason for home health care (List medical condition): unsteady gait   04/29/18 2253    sertraline (ZOLOFT) 50 MG tablet  Daily     04/29/18 2255           Liberty Handy, PA-C 04/29/18 2349    Arby Barrette, MD 04/30/18 [phone]

## 2018-04-29 NOTE — Progress Notes (Addendum)
CSW spoke to the patient who understood all instructions but stated she has cannot walk well.  CSW will ask the EPD to place a consult for CM with an order for face-to-face and an order for medical equipment, to see if the RN CM can attempt to seek a donated wheelchair to be delivered to [facility] for the patient so that the patient can use the bus pass for transport to the [facility], as the patient states she cannot safely stand at the bus statiion without fear of falling and has fallen in the past while waiting for a bus.  10:47 PM CSW spoke to EPD who will seek assistance from the RN to see if the pt can walk and if the pt is more appropriate for a wheelchair or for a walker/rolling walker.  CSW spoke to the pt's daughter [name], April Folksmanda who stated she is aware, will [facility] and voiced understanding of the pt's disposition.  10:52 PM EPD states pt can walk 10 feet and will attempt to place a RN CM consult with an order for face-to-face for medical equipment (roll-ator?).  CSW will continue to follow for D/C needs.  April PeaJonathan F. Marcele Kosta, LCSW, LCAS, CSI Clinical Social Worker Ph: [phone]

## 2018-04-29 NOTE — Progress Notes (Signed)
The following will be provided to the patient in writing and CSW will verify with the pt via interpreter that the patient understands the instructions:  Tonight, please arrive [facility]  Le[place]oy B[place] is expecting you and Le[place]oy [place] states you may sleep there tonight, but for one night only (Tuesday, December 17th, 2019).  On December 18th (Wednesday) and December 19th (Thursday) the [facility] WILL be open overnight for two nights due to the predicted winter weather which is predicted to be below freezing and you will be able to stay there.  Please go the [facility] tomorrow ([facility]) for medical and housing assistance and ask to see:  1.  [place] Placey with the medical team to be connected for medical services and free needed medications and .. 2.  3. The PATH team.  The P.A.T.H. team conducts outreach to clients to see patient's meet criteria for severe and persistent mental illness (or co- occurring disorders) and chronic homelessness and once they are enrolled then the PATH team conducts assertive case management with the patient by connecting them to mental health, primary care, help with clothing, food, Medicaid applications, etc.  The P.A.T.H. team also conducts housing assessments and submits them to [facility] and advocates for the patient to receive vouchers for permanent housing and in the meantime provides transport or provide bus passes when appropriate. 4.  You will be provided with a taxi tonight to take you to the shelter and with a bus pass tomorrow [facility] the [facility] along with the addresses of both.  If you point to the destination tomorrow ([facility] on the provided paper given to you tonight, the bus driver will make sure you get there safely.

## 2018-04-30 NOTE — Care Management Note (Signed)
Case Management Note  CM [facility] and [facility] for a homeless pt.  Pt was D/C from the ED last night.  CM is unable to assist with charity [facility] for a homeless pt at this time for multiple reasons.  The DME ordered is also not applicable for charity pts.  In addition pt would need to be here to speak in-person with the [facility] agency/DME agency to verify meeting charity qualifications and would need to sign forms to receive services.  Updated CSW and [place] via messages.  CM also sent a message to Lavinia SharpsMary Ann Placey, NP at the [facility] to follow up with pt for a possible DME needs and social needs.  No further ED CM needs noted at this time.  [name], April SandhoffAngela N, RN 04/30/2018, 10:08 AM

## 2018-05-14 ENCOUNTER — Inpatient Hospital Stay (HOSPITAL_COMMUNITY)
Admission: EM | Admit: 2018-05-14 | Discharge: 2018-05-23 | DRG: 292 | Disposition: A | Discharge status: Skilled Nursing Facility | Payer: Medicare Other | Attending: Family Medicine | Admitting: Family Medicine

## 2018-05-14 ENCOUNTER — Other Ambulatory Visit: Payer: Self-pay

## 2018-05-14 ENCOUNTER — Encounter (HOSPITAL_COMMUNITY): Payer: Self-pay

## 2018-05-14 ENCOUNTER — Emergency Department (HOSPITAL_COMMUNITY): Payer: Medicare Other

## 2018-05-14 DIAGNOSIS — I5032 Chronic diastolic (congestive) heart failure: Secondary | ICD-10-CM | POA: Diagnosis not present

## 2018-05-14 DIAGNOSIS — Z9049 Acquired absence of other specified parts of digestive tract: Secondary | ICD-10-CM

## 2018-05-14 DIAGNOSIS — E1169 Type 2 diabetes mellitus with other specified complication: Secondary | ICD-10-CM

## 2018-05-14 DIAGNOSIS — F332 Major depressive disorder, recurrent severe without psychotic features: Secondary | ICD-10-CM | POA: Diagnosis present

## 2018-05-14 DIAGNOSIS — Z90710 Acquired absence of both cervix and uterus: Secondary | ICD-10-CM

## 2018-05-14 DIAGNOSIS — E119 Type 2 diabetes mellitus without complications: Secondary | ICD-10-CM

## 2018-05-14 DIAGNOSIS — I110 Hypertensive heart disease with heart failure: Principal | ICD-10-CM | POA: Diagnosis present

## 2018-05-14 DIAGNOSIS — N289 Disorder of kidney and ureter, unspecified: Secondary | ICD-10-CM

## 2018-05-14 DIAGNOSIS — M25561 Pain in right knee: Secondary | ICD-10-CM | POA: Diagnosis present

## 2018-05-14 DIAGNOSIS — Z79899 Other long term (current) drug therapy: Secondary | ICD-10-CM

## 2018-05-14 DIAGNOSIS — I5033 Acute on chronic diastolic (congestive) heart failure: Secondary | ICD-10-CM | POA: Diagnosis present

## 2018-05-14 DIAGNOSIS — Z590 Homelessness: Secondary | ICD-10-CM

## 2018-05-14 DIAGNOSIS — Z6841 Body Mass Index (BMI) 40.0 and over, adult: Secondary | ICD-10-CM

## 2018-05-14 DIAGNOSIS — I10 Essential (primary) hypertension: Secondary | ICD-10-CM | POA: Diagnosis present

## 2018-05-14 DIAGNOSIS — I509 Heart failure, unspecified: Secondary | ICD-10-CM

## 2018-05-14 DIAGNOSIS — G8929 Other chronic pain: Secondary | ICD-10-CM | POA: Diagnosis present

## 2018-05-14 DIAGNOSIS — E1165 Type 2 diabetes mellitus with hyperglycemia: Secondary | ICD-10-CM | POA: Diagnosis present

## 2018-05-14 DIAGNOSIS — Z23 Encounter for immunization: Secondary | ICD-10-CM

## 2018-05-14 DIAGNOSIS — F419 Anxiety disorder, unspecified: Secondary | ICD-10-CM | POA: Diagnosis present

## 2018-05-14 DIAGNOSIS — H905 Unspecified sensorineural hearing loss: Secondary | ICD-10-CM | POA: Diagnosis present

## 2018-05-14 DIAGNOSIS — R739 Hyperglycemia, unspecified: Secondary | ICD-10-CM | POA: Diagnosis present

## 2018-05-14 MED ORDER — IPRATROPIUM-ALBUTEROL 0.5-2.5 (3) MG/3ML IN SOLN
3.0000 mL | Freq: Once | RESPIRATORY_TRACT | Status: AC
  Administered 2018-05-15: 3 mL via RESPIRATORY_TRACT
  Filled 2018-05-14: qty 3

## 2018-05-14 MED ORDER — FUROSEMIDE 10 MG/ML IJ SOLN
40.0000 mg | Freq: Once | INTRAMUSCULAR | Status: AC
  Administered 2018-05-15: 40 mg via INTRAVENOUS
  Filled 2018-05-14: qty 4

## 2018-05-14 MED ORDER — ALBUTEROL SULFATE (2.5 MG/3ML) 0.083% IN NEBU
5.0000 mg | INHALATION_SOLUTION | Freq: Once | RESPIRATORY_TRACT | Status: AC
  Administered 2018-05-14: 5 mg via RESPIRATORY_TRACT
  Filled 2018-05-14: qty 6

## 2018-05-14 NOTE — ED Triage Notes (Signed)
Pt reports SOB progressing over the last 2 days. Upon EMS arrival, patient was wheezing in all fields. Given 5mg  of albuterol and 125mg  of solumedrol en route. Pt is hearing impaired.  143/98; 98HR; 20g in LAC.

## 2018-05-14 NOTE — ED Notes (Signed)
RT notified of need for neb.

## 2018-05-14 NOTE — ED Provider Notes (Signed)
TIME SEEN: 11:55 PM  CHIEF COMPLAINT: Shortness of breath  HPI: Patient is a 58 year old female with history of hypertension, deafness who presents to the emergency department with shortness of breath over the past 2 to 3 days.  No productive cough, fever, chills.  No chest pain or chest discomfort but feels like she cannot breathe and this is worse with exertion.  Has had peripheral edema but states she feels like this is improved after recent visit to the ED and using Lasix.  No known history of heart failure.  Has not had recent echocardiogram.  She is homeless and is currently living in a homeless shelter.  Tonight had to call 911 to come to the ED because she could not catch her breath.  ROS: See HPI Constitutional: no fever  Eyes: no drainage  ENT: no runny nose   Cardiovascular:  no chest pain  Resp:  SOB  GI: no vomiting GU: no dysuria Integumentary: no rash  Allergy: no hives  Musculoskeletal: no leg swelling  Neurological: no slurred speech ROS otherwise negative  PAST MEDICAL HISTORY/PAST SURGICAL HISTORY:  Past Medical History:  Diagnosis Date  . Anxiety   . Arthritis    right hand and knee  . Chronic pain of right knee   . Deaf   . Depression   . Hypertension     MEDICATIONS:  Prior to Admission medications   Medication Sig Start Date End Date Taking? Authorizing Provider  acetaminophen (TYLENOL) 500 MG tablet Take 500 mg by mouth every 6 (six) hours as needed for mild pain.    [provider]  furosemide (LASIX) 40 MG tablet Take 1 tablet (40 mg total) by mouth daily. 04/18/18   Mancel Bale, MD  metoprolol tartrate (LOPRESSOR) 25 MG tablet Take 0.5 tablets (12.5 mg total) by mouth 2 (two) times daily. 04/18/18   Mancel Bale, MD  sertraline (ZOLOFT) 50 MG tablet Take 1 tablet (50 mg total) by mouth daily. 04/29/18 05/29/18  Liberty Handy, PA-C    ALLERGIES:  Allergies  Allergen Reactions  . Penicillins Rash    Has patient had a PCN reaction  causing immediate rash, facial/tongue/throat swelling, SOB or lightheadedness with hypotension: Unknown Has patient had a PCN reaction causing severe rash involving mucus membranes or skin necrosis: Unknown Has patient had a PCN reaction that required hospitalization: Unknown Has patient had a PCN reaction occurring within the last 10 years: No If all of the above answers are "NO", then may proceed with Cephalosporin use.     SOCIAL HISTORY:  Social History   Tobacco Use  . Smoking status: Never Smoker  . Smokeless tobacco: Never Used  Substance Use Topics  . Alcohol use: No    FAMILY HISTORY: History reviewed. No pertinent family history.  EXAM: BP (!) 167/91   Pulse 89   Temp 99.1 F (37.3 C)   Resp 16   SpO2 94%  CONSTITUTIONAL: Alert and oriented and responds appropriately to questions. Well-appearing; well-nourished HEAD: Normocephalic EYES: Conjunctivae clear, pupils appear equal, EOMI ENT: normal nose; moist mucous membranes NECK: Supple, no meningismus, no nuchal rigidity, no LAD  CARD: RRR; S1 and S2 appreciated; no murmurs, no clicks, no rubs, no gallops RESP: Patient is tachypneic, appears to have shortness of breath just sitting upright in the bed trying to talk to me, audible wheezes on exam, diffuse crackles appreciated, no rhonchi, sats in the low 90s with talking ABD/GI: Normal bowel sounds; non-distended; soft, non-tender, no rebound, no guarding, no  peritoneal signs, no hepatosplenomegaly BACK:  The back appears normal and is non-tender to palpation, there is no CVA tenderness EXT: Normal ROM in all joints; non-tender to palpation; peripheral edema noted to the knees bilaterally; normal capillary refill; no cyanosis, no calf tenderness or swelling    SKIN: Normal color for age and race; warm; no rash NEURO: Moves all extremities equally PSYCH: The patient's mood and manner are appropriate. Grooming and personal hygiene are appropriate.  MEDICAL DECISION  MAKING: Patient here with signs of volume overload.  Chest x-ray concerning for cardiomegaly with vascular congestion although she may have overt edema not seen on chest x-ray based on clinical exam.  No signs or symptoms concerning for pneumonia at this time but given she does live in a homeless shelter will obtain a flu swab.  Will obtain labs including troponin, BNP.  She has no chest pain at this time.  Will obtain EKG.  Anticipate admission for IV diuresis.  We will also give breathing treatment here in the emergency department.  ED PROGRESS: Patient's labs are reassuring.  BNP is low but may be falsely so due to obesity.  Troponin negative.  Will discuss with medicine for admission.  I feel she needs IV diuresis, further monitoring and likely an echocardiogram.  Discussed patient's case with hospitalist, Dr. Antionette Char.  I have recommended admission and patient (and family if present) agree with this plan. Admitting physician will place admission orders.   I reviewed all nursing notes, vitals, pertinent previous records, EKGs, lab and urine results, imaging (as available).       EKG Interpretation  Date/Time:  Thursday May 15 2018 02:03:39 EST Ventricular Rate:  95 PR Interval:    QRS Duration: 91 QT Interval:  375 QTC Calculation: 472 R Axis:   21 Text Interpretation:  Sinus rhythm Borderline T wave abnormalities No significant change since last tracing Confirmed by Ward, Baxter Hire [phone]) on 05/15/2018 2:29:03 AM          Ward, Layla Maw, DO 05/15/18 0230

## 2018-05-15 ENCOUNTER — Encounter (HOSPITAL_COMMUNITY): Payer: Self-pay | Admitting: Family Medicine

## 2018-05-15 ENCOUNTER — Observation Stay (HOSPITAL_BASED_OUTPATIENT_CLINIC_OR_DEPARTMENT_OTHER): Payer: Medicare Other

## 2018-05-15 DIAGNOSIS — I110 Hypertensive heart disease with heart failure: Secondary | ICD-10-CM | POA: Diagnosis present

## 2018-05-15 DIAGNOSIS — F419 Anxiety disorder, unspecified: Secondary | ICD-10-CM | POA: Diagnosis present

## 2018-05-15 DIAGNOSIS — I10 Essential (primary) hypertension: Secondary | ICD-10-CM | POA: Diagnosis not present

## 2018-05-15 DIAGNOSIS — I5032 Chronic diastolic (congestive) heart failure: Secondary | ICD-10-CM | POA: Diagnosis present

## 2018-05-15 DIAGNOSIS — Z9049 Acquired absence of other specified parts of digestive tract: Secondary | ICD-10-CM | POA: Diagnosis not present

## 2018-05-15 DIAGNOSIS — Z90710 Acquired absence of both cervix and uterus: Secondary | ICD-10-CM | POA: Diagnosis not present

## 2018-05-15 DIAGNOSIS — E1165 Type 2 diabetes mellitus with hyperglycemia: Secondary | ICD-10-CM | POA: Diagnosis present

## 2018-05-15 DIAGNOSIS — I5033 Acute on chronic diastolic (congestive) heart failure: Secondary | ICD-10-CM | POA: Diagnosis present

## 2018-05-15 DIAGNOSIS — F332 Major depressive disorder, recurrent severe without psychotic features: Secondary | ICD-10-CM | POA: Diagnosis present

## 2018-05-15 DIAGNOSIS — M25561 Pain in right knee: Secondary | ICD-10-CM | POA: Diagnosis present

## 2018-05-15 DIAGNOSIS — Z23 Encounter for immunization: Secondary | ICD-10-CM | POA: Diagnosis present

## 2018-05-15 DIAGNOSIS — E1169 Type 2 diabetes mellitus with other specified complication: Secondary | ICD-10-CM | POA: Diagnosis not present

## 2018-05-15 DIAGNOSIS — G8929 Other chronic pain: Secondary | ICD-10-CM | POA: Diagnosis present

## 2018-05-15 DIAGNOSIS — N289 Disorder of kidney and ureter, unspecified: Secondary | ICD-10-CM

## 2018-05-15 DIAGNOSIS — Z590 Homelessness: Secondary | ICD-10-CM | POA: Diagnosis not present

## 2018-05-15 DIAGNOSIS — I509 Heart failure, unspecified: Secondary | ICD-10-CM

## 2018-05-15 DIAGNOSIS — R739 Hyperglycemia, unspecified: Secondary | ICD-10-CM | POA: Diagnosis not present

## 2018-05-15 DIAGNOSIS — R0602 Shortness of breath: Secondary | ICD-10-CM | POA: Diagnosis not present

## 2018-05-15 DIAGNOSIS — Z6841 Body Mass Index (BMI) 40.0 and over, adult: Secondary | ICD-10-CM | POA: Diagnosis not present

## 2018-05-15 DIAGNOSIS — Z79899 Other long term (current) drug therapy: Secondary | ICD-10-CM | POA: Diagnosis not present

## 2018-05-15 DIAGNOSIS — H905 Unspecified sensorineural hearing loss: Secondary | ICD-10-CM | POA: Diagnosis present

## 2018-05-15 LAB — GLUCOSE, CAPILLARY
Glucose-Capillary: 349 mg/dL — ABNORMAL HIGH (ref 70–99)
Glucose-Capillary: 280 mg/dL — ABNORMAL HIGH (ref 70–99)
Glucose-Capillary: 270 mg/dL — ABNORMAL HIGH (ref 70–99)
Glucose-Capillary: 136 mg/dL — ABNORMAL HIGH (ref 70–99)

## 2018-05-15 LAB — CBC WITH DIFFERENTIAL/PLATELET
Abs Immature Granulocytes: 0.03 10*3/uL (ref 0.00–0.07)
Basophils Absolute: 0 10*3/uL (ref 0.0–0.1)
Basophils Relative: 0 %
Eosinophils Absolute: 0 10*3/uL (ref 0.0–0.5)
Eosinophils Relative: 1 %
HCT: 43 % (ref 36.0–46.0)
Hemoglobin: 12.6 g/dL (ref 12.0–15.0)
Immature Granulocytes: 0 %
Lymphocytes Relative: 16 %
Lymphs Abs: 1.4 10*3/uL (ref 0.7–4.0)
MCH: 26.8 pg (ref 26.0–34.0)
MCHC: 29.3 g/dL — ABNORMAL LOW (ref 30.0–36.0)
MCV: 91.5 fL (ref 80.0–100.0)
MONO ABS: 0.4 10*3/uL (ref 0.1–1.0)
Monocytes Relative: 5 %
Neutro Abs: 6.7 10*3/uL (ref 1.7–7.7)
Neutrophils Relative %: 78 %
Platelets: 219 10*3/uL (ref 150–400)
RBC: 4.7 MIL/uL (ref 3.87–5.11)
RDW: 15 % (ref 11.5–15.5)
WBC: 8.6 10*3/uL (ref 4.0–10.5)
nRBC: 0 % (ref 0.0–0.2)

## 2018-05-15 LAB — BASIC METABOLIC PANEL
Anion gap: 10 (ref 5–15)
BUN: 13 mg/dL (ref 6–20)
CO2: 26 mmol/L (ref 22–32)
Calcium: 8.7 mg/dL — ABNORMAL LOW (ref 8.9–10.3)
Chloride: 102 mmol/L (ref 98–111)
Creatinine, Ser: 1.26 mg/dL — ABNORMAL HIGH (ref 0.44–1.00)
GFR calc Af Amer: 55 mL/min — ABNORMAL LOW (ref >60)
GFR calc non Af Amer: 47 mL/min — ABNORMAL LOW (ref >60)
Glucose, Bld: 255 mg/dL — ABNORMAL HIGH (ref 70–99)
Potassium: 3.4 mmol/L — ABNORMAL LOW (ref 3.5–5.1)
Sodium: 138 mmol/L (ref 135–145)

## 2018-05-15 LAB — INFLUENZA PANEL BY PCR (TYPE A & B)
Influenza A By PCR: NEGATIVE
Influenza B By PCR: NEGATIVE

## 2018-05-15 LAB — ECHOCARDIOGRAM COMPLETE
Height: 64 in
Weight: 5908.33 oz

## 2018-05-15 LAB — HIV ANTIBODY (ROUTINE TESTING W REFLEX): HIV Screen 4th Generation wRfx: NONREACTIVE

## 2018-05-15 LAB — BRAIN NATRIURETIC PEPTIDE: B Natriuretic Peptide: 44.5 pg/mL (ref 0.0–100.0)

## 2018-05-15 LAB — TROPONIN I: Troponin I: <0.03 ng/mL (ref <0.03)

## 2018-05-15 LAB — HEMOGLOBIN A1C
Hgb A1c MFr Bld: 7.2 % — ABNORMAL HIGH (ref 4.8–5.6)
Mean Plasma Glucose: 159.94 mg/dL

## 2018-05-15 MED ORDER — POTASSIUM CHLORIDE CRYS ER 20 MEQ PO TBCR
20.0000 meq | EXTENDED_RELEASE_TABLET | Freq: Once | ORAL | Status: AC
  Administered 2018-05-15: 20 meq via ORAL
  Filled 2018-05-15: qty 1

## 2018-05-15 MED ORDER — SODIUM CHLORIDE 0.9% FLUSH
3.0000 mL | Freq: Two times a day (BID) | INTRAVENOUS | Status: DC
  Administered 2018-05-15 – 2018-05-23 (×18): 3 mL via INTRAVENOUS

## 2018-05-15 MED ORDER — INFLUENZA VAC SPLIT QUAD 0.5 ML IM SUSY
0.5000 mL | PREFILLED_SYRINGE | INTRAMUSCULAR | Status: AC
  Administered 2018-05-16: 0.5 mL via INTRAMUSCULAR
  Filled 2018-05-15: qty 0.5

## 2018-05-15 MED ORDER — SODIUM CHLORIDE 0.9% FLUSH: 3.0000 mL | INTRAVENOUS | Status: DC | PRN

## 2018-05-15 MED ORDER — ACETAMINOPHEN 325 MG PO TABS: 650.0000 mg | ORAL_TABLET | ORAL | Status: DC | PRN

## 2018-05-15 MED ORDER — SERTRALINE HCL 50 MG PO TABS
50.0000 mg | ORAL_TABLET | Freq: Every day | ORAL | Status: DC
  Administered 2018-05-15 – 2018-05-23 (×9): 50 mg via ORAL
  Filled 2018-05-15 (×9): qty 1

## 2018-05-15 MED ORDER — FUROSEMIDE 10 MG/ML IJ SOLN
20.0000 mg | Freq: Every day | INTRAMUSCULAR | Status: DC
  Administered 2018-05-16 – 2018-05-17 (×2): 20 mg via INTRAVENOUS
  Filled 2018-05-15 (×2): qty 2

## 2018-05-15 MED ORDER — METOPROLOL TARTRATE 25 MG PO TABS
12.5000 mg | ORAL_TABLET | Freq: Two times a day (BID) | ORAL | Status: DC
  Administered 2018-05-15 – 2018-05-23 (×16): 12.5 mg via ORAL
  Filled 2018-05-15 (×16): qty 1

## 2018-05-15 MED ORDER — FUROSEMIDE 10 MG/ML IJ SOLN: 40.0000 mg | Freq: Two times a day (BID) | INTRAMUSCULAR | Status: DC

## 2018-05-15 MED ORDER — INSULIN ASPART 100 UNIT/ML ~~LOC~~ SOLN
0.0000 [IU] | Freq: Three times a day (TID) | SUBCUTANEOUS | Status: DC
  Administered 2018-05-15 (×2): 5 [IU] via SUBCUTANEOUS
  Administered 2018-05-15: 1 [IU] via SUBCUTANEOUS
  Administered 2018-05-16 – 2018-05-21 (×13): 2 [IU] via SUBCUTANEOUS
  Administered 2018-05-21: 3 [IU] via SUBCUTANEOUS
  Administered 2018-05-22: 2 [IU] via SUBCUTANEOUS
  Administered 2018-05-22: 1 [IU] via SUBCUTANEOUS
  Administered 2018-05-22: 3 [IU] via SUBCUTANEOUS
  Administered 2018-05-23: 2 [IU] via SUBCUTANEOUS
  Administered 2018-05-23: 3 [IU] via SUBCUTANEOUS

## 2018-05-15 MED ORDER — SODIUM CHLORIDE 0.9 % IV SOLN: 250.0000 mL | INTRAVENOUS | Status: DC | PRN

## 2018-05-15 MED ORDER — ONDANSETRON HCL 4 MG/2ML IJ SOLN: 4.0000 mg | Freq: Four times a day (QID) | INTRAMUSCULAR | Status: DC | PRN

## 2018-05-15 MED ORDER — ALBUTEROL SULFATE (2.5 MG/3ML) 0.083% IN NEBU
2.5000 mg | INHALATION_SOLUTION | RESPIRATORY_TRACT | Status: DC | PRN
  Administered 2018-05-15 – 2018-05-20 (×6): 2.5 mg via RESPIRATORY_TRACT
  Filled 2018-05-15 (×6): qty 3

## 2018-05-15 MED ORDER — FUROSEMIDE 10 MG/ML IJ SOLN
40.0000 mg | Freq: Every day | INTRAMUSCULAR | Status: DC
  Administered 2018-05-15: 40 mg via INTRAVENOUS
  Filled 2018-05-15: qty 4

## 2018-05-15 MED ORDER — ENOXAPARIN SODIUM 40 MG/0.4ML ~~LOC~~ SOLN
40.0000 mg | SUBCUTANEOUS | Status: DC
  Administered 2018-05-15: 40 mg via SUBCUTANEOUS
  Filled 2018-05-15 (×2): qty 0.4

## 2018-05-15 MED ORDER — PNEUMOCOCCAL VAC POLYVALENT 25 MCG/0.5ML IJ INJ
0.5000 mL | INJECTION | INTRAMUSCULAR | Status: AC
  Administered 2018-05-16: 0.5 mL via INTRAMUSCULAR
  Filled 2018-05-15: qty 0.5

## 2018-05-15 MED ORDER — NYSTATIN 100000 UNIT/GM EX POWD
Freq: Two times a day (BID) | CUTANEOUS | Status: DC
  Administered 2018-05-15 – 2018-05-23 (×16): via TOPICAL
  Filled 2018-05-15: qty 15

## 2018-05-15 MED ORDER — INSULIN ASPART 100 UNIT/ML ~~LOC~~ SOLN
0.0000 [IU] | Freq: Every day | SUBCUTANEOUS | Status: DC
  Administered 2018-05-15: 4 [IU] via SUBCUTANEOUS
  Administered 2018-05-17: 2 [IU] via SUBCUTANEOUS

## 2018-05-15 NOTE — H&P (Signed)
History and Physical    [name] EML:[id number] DOB: August 01, 1960 DOA: 05/14/2018  PCP: Marden Noble, MD   Patient coming from: Homeless shelter   Chief Complaint: SOB  HPI: [name] is a 58 y.o. female with medical history significant for hearing impairment, depression, hypertension, and bilateral lower extremity edema treated with a course of Lasix recently, now presenting to the emergency department with the insidious development of shortness of breath over the past 2 days, worsening acutely overnight and prompting her to call EMS.  She had been seen in the emergency department approximately 1 month ago for progressive edema involving the bilateral lower legs, was prescribed a course of Lasix and reports improvement in the swelling at that time, but over the past 2 days has become increasingly short of breath, worse with any exertion, but was having difficulty catching her breath at rest overnight and called EMS.  She denies any chest pain, palpitations, reports a mild nonproductive cough, denies any significant rhinorrhea or sore throat, and has not appreciated any fever or chills.  She does not have history of smoking, asthma, or COPD.  She was treated with 125 mg of IV Solu-Medrol and albuterol neb prior to arrival in the ED.  ED Course: Upon arrival to the ED, patient is found to be afebrile, saturating low 90s on room air, and with vitals otherwise stable.  EKG features a sinus rhythm and chest x-ray is notable for cardiomegaly with vascular congestion and streaky atelectasis versus bronchitic changes at the bases.  Chemistry panel is notable for a potassium of 3.4, glucose 255, and creatinine 1.26, similar to priors.  CBC is unremarkable, influenza PCR is negative, troponin undetectable, and BNP is normal.  Patient was given 40 mg IV Lasix and albuterol neb in the ED.  She reports some slight improvement with this, but remains short of breath at rest and will be observed for  further evaluation and management of dyspnea suspected secondary to CHF.  Review of Systems:  All other systems reviewed and apart from HPI, are negative.  Past Medical History:  Diagnosis Date  . Anxiety   . Arthritis    right hand and knee  . Chronic pain of right knee   . Deaf   . Depression   . Hypertension     Past Surgical History:  Procedure Laterality Date  . ABDOMINAL HYSTERECTOMY  2008  . CHOLECYSTECTOMY    . HERNIA REPAIR     ? umbicical  . KNEE ARTHROSCOPY Right 04/13/2015   Procedure: RIGHT KNEE ARTHROSCOPY,Partial Medial Meniscectomy, Chondroplasty Medial Lateral, Medial Plica Excision;  Surgeon: Jodi Geralds, MD;  Location: [facility] OR;  Service: Orthopedics;  Laterality: Right;  . UTERINE FIBROID SURGERY    . WISDOM TOOTH EXTRACTION     patient denies     reports that she has never smoked. She has never used smokeless tobacco. She reports that she does not drink alcohol or use drugs.  Allergies  Allergen Reactions  . Penicillins Rash    Has patient had a PCN reaction causing immediate rash, facial/tongue/throat swelling, SOB or lightheadedness with hypotension: Unknown Has patient had a PCN reaction causing severe rash involving mucus membranes or skin necrosis: Unknown Has patient had a PCN reaction that required hospitalization: Unknown Has patient had a PCN reaction occurring within the last 10 years: No If all of the above answers are "NO", then may proceed with Cephalosporin use.     Family History  Problem Relation Age of Onset  .  Other Mother      Prior to Admission medications   Medication Sig Start Date End Date Taking? Authorizing Provider  acetaminophen (TYLENOL) 500 MG tablet Take 500 mg by mouth every 6 (six) hours as needed for mild pain.   Yes [provider]  metoprolol tartrate (LOPRESSOR) 25 MG tablet Take 0.5 tablets (12.5 mg total) by mouth 2 (two) times daily. 04/18/18  Yes Mancel Bale, MD  sertraline (ZOLOFT) 50 MG tablet  Take 1 tablet (50 mg total) by mouth daily. 04/29/18 05/29/18 Yes Liberty Handy, PA-C  furosemide (LASIX) 40 MG tablet Take 1 tablet (40 mg total) by mouth daily. Patient not taking: Reported on 05/15/2018 04/18/18   Mancel Bale, MD    Physical Exam: Vitals:   05/14/18 2231 05/14/18 2301 05/15/18 0000 05/15/18 0030  BP: (!) 167/91  129/76 137/74  Pulse: 89  87 89  Resp: 16   18  Temp: 99.1 F (37.3 C)     SpO2: 95% 94% 93% 92%     Constitutional: NAD, calm, comfortable Eyes: PERTLA, lids and conjunctivae normal ENMT: Mucous membranes are moist. Posterior pharynx clear of any exudate or lesions.   Neck: normal, supple, no masses, no thyromegaly Respiratory: Increased WOB, rales at lower lung fields bilaterally. No pallor or cyanosis.   Cardiovascular: S1 & S2 heard, regular rate and rhythm. Pitting edema to bilateral LE's. Abdomen: No distension, no tenderness, soft. Bowel sounds active.  Musculoskeletal: no clubbing / cyanosis. No joint deformity upper and lower extremities.    Skin: no significant rashes, lesions, ulcers. Warm, dry, well-perfused. Neurologic: No facial asymmetry. Gross hearing deficit. Sensation intact. Moving all extremities.  Psychiatric:  Alert and oriented to person, place, and situation. Pleasant and cooperative.    Labs on Admission: I have personally reviewed following labs and imaging studies  CBC: Recent Labs  Lab 05/14/18 2352  WBC 8.6  NEUTROABS 6.7  HGB 12.6  HCT 43.0  MCV 91.5  PLT 219   Basic Metabolic Panel: Recent Labs  Lab 05/14/18 2352  NA 138  K 3.4*  CL 102  CO2 26  GLUCOSE 255*  BUN 13  CREATININE 1.26*  CALCIUM 8.7*   GFR: CrCl cannot be calculated (Unknown ideal weight.). Liver Function Tests: No results for input(s): AST, ALT, ALKPHOS, BILITOT, PROT, ALBUMIN in the last 168 hours. No results for input(s): LIPASE, AMYLASE in the last 168 hours. No results for input(s): AMMONIA in the last 168 hours. Coagulation  Profile: No results for input(s): INR, PROTIME in the last 168 hours. Cardiac Enzymes: Recent Labs  Lab 05/15/18 0003  TROPONINI <0.03   BNP (last 3 results) No results for input(s): PROBNP in the last 8760 hours. HbA1C: No results for input(s): HGBA1C in the last 72 hours. CBG: No results for input(s): GLUCAP in the last 168 hours. Lipid Profile: No results for input(s): CHOL, HDL, LDLCALC, TRIG, CHOLHDL, LDLDIRECT in the last 72 hours. Thyroid Function Tests: No results for input(s): TSH, T4TOTAL, FREET4, T3FREE, THYROIDAB in the last 72 hours. Anemia Panel: No results for input(s): VITAMINB12, FOLATE, FERRITIN, TIBC, IRON, RETICCTPCT in the last 72 hours. Urine analysis:    Component Value Date/Time   COLORURINE YELLOW 12/02/2014 1957   APPEARANCEUR CLOUDY (A) 12/02/2014 1957   LABSPEC 1.020 12/02/2014 1957   PHURINE 6.0 12/02/2014 1957   GLUCOSEU NEGATIVE 12/02/2014 1957   HGBUR NEGATIVE 12/02/2014 1957   BILIRUBINUR NEGATIVE 12/02/2014 1957   BILIRUBINUR neg 03/02/2014 1849   KETONESUR NEGATIVE 12/02/2014 1957  PROTEINUR NEGATIVE 12/02/2014 1957   UROBILINOGEN 0.2 12/02/2014 1957   NITRITE NEGATIVE 12/02/2014 1957   LEUKOCYTESUR NEGATIVE 12/02/2014 1957   Sepsis Labs: @LABRCNTIP (procalcitonin:4,lacticidven:4) )No results found for this or any previous visit (from the past 240 hour(s)).   Radiological Exams on Admission: Dg Chest 2 View  Result Date: 05/14/2018 CLINICAL DATA:  Shortness of breath EXAM: CHEST - 2 VIEW COMPARISON:  04/18/2018 FINDINGS: No pleural effusion. Mild cardiomegaly with central vascular congestion. Streaky atelectasis at the bases. No pneumothorax. Degenerative changes of the spine. IMPRESSION: 1. Cardiomegaly with vascular congestion 2. Streaky atelectasis or bronchitic changes at both bases. Electronically Signed   By: Jasmine PangKim  Fujinaga M.D.   On: 05/14/2018 23:07    EKG: Independently reviewed. Sinus rhythm, flattened T-waves.    Assessment/Plan   1. Acute CHF  - Presents with 2 days of progressive SOB with mild non-productive cough, bilateral leg swelling, no fever, no URI sxs  - CXR with cardiomegaly, flu negative, peripheral edema and rales on exam - She was reportedly wheezing with EMS and given steroids and nebs; no wheezing in ED, possibly from treatments received prior to arrival, but history, exam, and CXR seems more consistent with CHF  - Treated in ED with nebs and 40 mg IV Lasix  - Continue diuresis with Lasix 40 mg IV q12h, follow daily wt and I/O's, check echocardiogram, continue beta-blocker as tolerated    2. Depression  - Stable  - Continue Zoloft    3. Hypertension  - BP at goal  - Continue metoprolol as tolerated    4. Mild renal insufficiency  - SCr is 1.26 on admission with priors ranging from 1-1.2  - Follow daily chem panel during IV diuresis, renally-dose medications   5. Hyperglycemia  - Serum glucose is 255 in ED  - Denies hx of DM, A1c was 6.2% in 2015  - Possibly secondary to Solu-Medrol given prior to arrival in ED  - Check A1c, follow CBG's, use a low-intensity SSI with Novolog as needed    DVT prophylaxis: Lovenox  Code Status: Full  Family Communication: Discussed with patient  Consults called: None Admission status: Observation     Briscoe Deutscherimothy S , MD [facility] Pager [phone]  If 7PM-7AM, please contact night-coverage [email or web address] Password [email or web address]  05/15/2018, 2:27 AM

## 2018-05-15 NOTE — Progress Notes (Signed)
Care assumed from previous RN, in agreement with assessment documented. Will continue to monitor patient and communicate using ASL translator.

## 2018-05-15 NOTE — Progress Notes (Signed)
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 who came in with symptoms of heart failure-she had been seen in the emergency room 12/5 and 04/29/2018 On first visit 12/5 she was seen in the emergency room--had a normal BNP normal troponin chest x-ray however concerning for cardiomegaly vascular congestion and she was given Lasix for 1 week and told to elevate her feet She completed treatment with this but then came back to the hospital as per my partner note  I agree with everything in terms of planning she tells me she took Lasix for 5 days overall her shortness of breath is better we will add TED hose we will dose her with Lasix once daily IV and we will continue to monitor for echocardiogram and see how she does Her case is complicated by relatively poor health literacy-she has relatives in town but she is estranged from them as "they do not want to take care of me and I do not know what the exact circumstances are so social work will look in on this and investigate My my thoughts are that she will need to be somewhat euvolemic and have a relatively safe discharge plan with access to education and follow-up prior to discharge  Pleas Koch, MD [facility] 11:36 AM

## 2018-05-15 NOTE — Progress Notes (Signed)
  Echocardiogram 2D Echocardiogram has been performed.  Tye Savoy 05/15/2018, 9:03 AM

## 2018-05-15 NOTE — ED Notes (Signed)
ED TO INPATIENT HANDOFF REPORT  Name/Age/Gender [name] 58 y.o. female  Code Status    Code Status Orders  (From admission, onward)         Start     Ordered   05/15/18 0151  Full code  Continuous     05/15/18 0152        Code Status History    Date Active Date Inactive Code Status Order ID Comments User Context   12/09/2014 1852 12/16/2014 1734 Full Code [id number]  Adonis Brook, NP Inpatient   12/09/2014 1348 12/09/2014 1852 Full Code [id number]  [facility]      Home/SNF/Other Home  Chief Complaint SOB  Level of Care/Admitting Diagnosis ED Disposition    ED Disposition Condition Comment   Admit  Hospital Area: [facility] [[id number]]  Level of Care: Telemetry [5]  Admit to tele based on following criteria: Acute CHF  Diagnosis: Acute CHF (congestive heart failure) [facility]) [[id number]]  Admitting Physician: Briscoe Deutscher [[id number]]  Attending Physician: Briscoe Deutscher [[id number]]  PT Class (Do Not Modify): Observation [104]  PT Acc Code (Do Not Modify): Observation [10022]       Medical History Past Medical History:  Diagnosis Date  . Anxiety   . Arthritis    right hand and knee  . Chronic pain of right knee   . Deaf   . Depression   . Hypertension     Allergies Allergies  Allergen Reactions  . Penicillins Rash    Has patient had a PCN reaction causing immediate rash, facial/tongue/throat swelling, SOB or lightheadedness with hypotension: Unknown Has patient had a PCN reaction causing severe rash involving mucus membranes or skin necrosis: Unknown Has patient had a PCN reaction that required hospitalization: Unknown Has patient had a PCN reaction occurring within the last 10 years: No If all of the above answers are "NO", then may proceed with Cephalosporin use.     IV Location/Drains/Wounds Patient Lines/Drains/Airways Status   Active Line/Drains/Airways    Name:   Placement date:   Placement time:   Site:    Days:   Peripheral IV 05/14/18 Left Arm   05/14/18    2347    Arm   1   Incision (Closed) 04/13/15 Knee Right   04/13/15    1550     1128          Labs/Imaging Results for orders placed or performed during the hospital encounter of 05/14/18 (from the past 48 hour(s))  CBC with Differential     Status: Abnormal   Collection Time: 05/14/18 11:52 PM  Result Value Ref Range   WBC 8.6 4.0 - 10.5 K/uL   RBC 4.70 3.87 - 5.11 MIL/uL   Hemoglobin 12.6 12.0 - 15.0 g/dL   HCT 93.2 67.1 - 24.5 %   MCV 91.5 80.0 - 100.0 fL   MCH 26.8 26.0 - 34.0 pg   MCHC 29.3 (L) 30.0 - 36.0 g/dL   RDW 80.9 98.3 - 38.2 %   Platelets 219 150 - 400 K/uL   nRBC 0.0 0.0 - 0.2 %   Neutrophils Relative % 78 %   Neutro Abs 6.7 1.7 - 7.7 K/uL   Lymphocytes Relative 16 %   Lymphs Abs 1.4 0.7 - 4.0 K/uL   Monocytes Relative 5 %   Monocytes Absolute 0.4 0.1 - 1.0 K/uL   Eosinophils Relative 1 %   Eosinophils Absolute 0.0 0.0 - 0.5 K/uL   Basophils Relative 0 %  Basophils Absolute 0.0 0.0 - 0.1 K/uL   Immature Granulocytes 0 %   Abs Immature Granulocytes 0.03 0.00 - 0.07 K/uL    Comment: Performed at [facility], [address] [place], [place] [address]  Basic metabolic panel     Status: Abnormal   Collection Time: 05/14/18 11:52 PM  Result Value Ref Range   Sodium 138 135 - 145 mmol/L   Potassium 3.4 (L) 3.5 - 5.1 mmol/L   Chloride 102 98 - 111 mmol/L   CO2 26 22 - 32 mmol/L   Glucose, Bld 255 (H) 70 - 99 mg/dL   BUN 13 6 - 20 mg/dL   Creatinine, Ser 6.041.26 (H) 0.44 - 1.00 mg/dL   Calcium 8.7 (L) 8.9 - 10.3 mg/dL   GFR calc non Af Amer 47 (L) >60 mL/min   GFR calc Af Amer 55 (L) >60 mL/min   Anion gap 10 5 - 15    Comment: Performed at [facility], [address] [place], [place] [address]  Brain natriuretic peptide     Status: None   Collection Time: 05/14/18 11:52 PM  Result Value Ref Range   B Natriuretic Peptide 44.5 0.0 - 100.0 pg/mL    Comment: Performed  at [facility], [address] [place], [place] [address]  Troponin I - ONCE - STAT     Status: None   Collection Time: 05/15/18 12:03 AM  Result Value Ref Range   Troponin I <0.03 <0.03 ng/mL    Comment: Performed at [facility], [address] [place], [place] [address]  Influenza panel by PCR (type A & B)     Status: None   Collection Time: 05/15/18 12:22 AM  Result Value Ref Range   Influenza A By PCR NEGATIVE NEGATIVE   Influenza B By PCR NEGATIVE NEGATIVE    Comment: (NOTE) The Xpert Xpress Flu assay is intended as an aid in the diagnosis of  influenza and should not be used as a sole basis for treatment.  This  assay is FDA approved for nasopharyngeal swab specimens only. Nasal  washings and aspirates are unacceptable for Xpert Xpress Flu testing. Performed at [facility], [address] [place], [place] [address]    Dg Chest 2 View  Result Date: 05/14/2018 CLINICAL DATA:  Shortness of breath EXAM: CHEST - 2 VIEW COMPARISON:  04/18/2018 FINDINGS: No pleural effusion. Mild cardiomegaly with central vascular congestion. Streaky atelectasis at the bases. No pneumothorax. Degenerative changes of the spine. IMPRESSION: 1. Cardiomegaly with vascular congestion 2. Streaky atelectasis or bronchitic changes at both bases. Electronically Signed   By: Jasmine PangKim  Fujinaga M.D.   On: 05/14/2018 23:07    Pending Labs Unresulted Labs (From admission, onward)    Start     Ordered   05/22/18 0500  Creatinine, serum  (enoxaparin (LOVENOX)    CrCl >/= 30 ml/min)  Weekly,   R    Comments:  while on enoxaparin therapy    05/15/18 0152   05/16/18 0500  Basic metabolic panel  Daily,   R     05/15/18 0152   05/16/18 0500  Magnesium  (Magnesium replacement + AM level)  Once,   R     05/15/18 0152   05/15/18 0500  HIV antibody (Routine Testing)  Tomorrow morning,   R     05/15/18 0152   05/15/18 0500  Hemoglobin A1c  Tomorrow morning,   R  Comments:  To assess prior glycemic control    05/15/18 0152          Vitals/Pain Today's Vitals   05/14/18 2233 05/14/18 2301 05/15/18 0000 05/15/18 0030  BP:   129/76 137/74  Pulse:   87 89  Resp:    18  Temp:      SpO2:  94% 93% 92%  PainSc: 0-No pain       Isolation Precautions No active isolations  Medications Medications  metoprolol tartrate (LOPRESSOR) tablet 12.5 mg (has no administration in time range)  sertraline (ZOLOFT) tablet 50 mg (has no administration in time range)  sodium chloride flush (NS) 0.9 % injection 3 mL (has no administration in time range)  sodium chloride flush (NS) 0.9 % injection 3 mL (has no administration in time range)  0.9 %  sodium chloride infusion (has no administration in time range)  acetaminophen (TYLENOL) tablet 650 mg (has no administration in time range)  ondansetron (ZOFRAN) injection 4 mg (has no administration in time range)  enoxaparin (LOVENOX) injection 40 mg (has no administration in time range)  furosemide (LASIX) injection 40 mg (has no administration in time range)  potassium chloride SA (K-DUR,KLOR-CON) CR tablet 20 mEq (has no administration in time range)  insulin aspart (novoLOG) injection 0-9 Units (has no administration in time range)  insulin aspart (novoLOG) injection 0-5 Units (has no administration in time range)  albuterol (PROVENTIL) (2.5 MG/3ML) 0.083% nebulizer solution 2.5 mg (has no administration in time range)  albuterol (PROVENTIL) (2.5 MG/3ML) 0.083% nebulizer solution 5 mg (5 mg Nebulization Given 05/14/18 2300)  ipratropium-albuterol (DUONEB) 0.5-2.5 (3) MG/3ML nebulizer solution 3 mL (3 mLs Nebulization Given 05/15/18 0015)  furosemide (LASIX) injection 40 mg (40 mg Intravenous Given 05/15/18 0009)    Mobility Walks with assistance.

## 2018-05-15 NOTE — Progress Notes (Signed)
CSW spoke with Tyra, Child psychotherapist at [facility], regarding patients stay at the shelter. CSW was informed Casimiro Needle, at the [facility], is responsible for determining stays at shelter and he has left for the afternoon.   Tyra informed CSW she would email/ leave message for Casimiro Needle to contact this CSW. CSW will follow up in the AM.   Stacy Gardner, LCSW Clinical Social Worker  [facility]  [phone]

## 2018-05-16 LAB — RENAL FUNCTION PANEL
Albumin: 3.4 g/dL — ABNORMAL LOW (ref 3.5–5.0)
Anion gap: 11 (ref 5–15)
BUN: 20 mg/dL (ref 6–20)
CO2: 28 mmol/L (ref 22–32)
Calcium: 8.8 mg/dL — ABNORMAL LOW (ref 8.9–10.3)
Chloride: 99 mmol/L (ref 98–111)
Creatinine, Ser: 1.07 mg/dL — ABNORMAL HIGH (ref 0.44–1.00)
GFR calc Af Amer: >60 mL/min (ref >60)
GFR calc non Af Amer: 58 mL/min — ABNORMAL LOW (ref >60)
Glucose, Bld: 196 mg/dL — ABNORMAL HIGH (ref 70–99)
Phosphorus: 3.4 mg/dL (ref 2.5–4.6)
Potassium: 3.7 mmol/L (ref 3.5–5.1)
Sodium: 138 mmol/L (ref 135–145)

## 2018-05-16 LAB — MAGNESIUM: Magnesium: 2.2 mg/dL (ref 1.7–2.4)

## 2018-05-16 LAB — GLUCOSE, CAPILLARY
GLUCOSE-CAPILLARY: 167 mg/dL — AB (ref 70–99)
GLUCOSE-CAPILLARY: 199 mg/dL — AB (ref 70–99)
Glucose-Capillary: 150 mg/dL — ABNORMAL HIGH (ref 70–99)
Glucose-Capillary: 199 mg/dL — ABNORMAL HIGH (ref 70–99)
Glucose-Capillary: 192 mg/dL — ABNORMAL HIGH (ref 70–99)

## 2018-05-16 MED ORDER — GLIMEPIRIDE 2 MG PO TABS
2.0000 mg | ORAL_TABLET | Freq: Every day | ORAL | Status: DC
  Administered 2018-05-17 – 2018-05-23 (×7): 2 mg via ORAL
  Filled 2018-05-16 (×7): qty 1

## 2018-05-16 NOTE — Progress Notes (Signed)
Nutrition Education Note  RD consulted for nutrition education low sodium diet education and newly dx DM with HgbAc1 7.2%.   Patient is deaf; iPad used with interpretor for ASL [name] [phone]).   RD provided provided and reviewed handouts from the Academy of Nutrition and Dietetics in detail: foods that are high in sodium, foods that are low in sodium, sodium-free flavoring tips, label reading tips, and carbohydrate counting for individuals with diabetes.  Patient is homeless and currently resides in a shelter. She reports that she does not have control over the foods provided, which are mainly items that are high in sodium and sugar. She does not have a support system and reports that she is "addicted to food" and is an emotional eater. She tries to distract herself by doing things on her phone or coloring but reports very weak willpower or ability to ignore cravings and urges.   Reviewed patient's dietary recall. Provided examples on ways to decrease sodium intake in diet. Discouraged intake of processed foods and use of salt shaker. Encouraged fresh fruits and vegetables as well as whole grain sources of carbohydrates to maximize fiber intake.  Discussed different food groups and their effects on blood sugar, emphasizing carbohydrate-containing foods. Provided list of carbohydrates and recommended serving sizes of common foods.   Discussed importance of controlled and consistent carbohydrate intake throughout the day, the importance of adhering to diet recommendations, and high sodium foods to avoid. Provided examples of ways to balance meals/snacks and encouraged intake of high-fiber, whole grain complex carbohydrates. Teach back method used.   Expect fair compliance based on social and environmental circumstances.  Body mass index is 62.59 kg/m. Pt meets criteria for morbid obesity based on current BMI.  Current diet order is Heart Healthy/Carb Modified, patient is consuming approximately  100% of meals at this time. Labs and medications reviewed. No further nutrition interventions warranted at this time. RD contact information provided. If additional nutrition issues arise, please re-consult RD.     Trenton Gammon, MS, RD, LDN, [facility] Inpatient Clinical Dietitian Pager # [phone] After hours/weekend pager # [phone]

## 2018-05-16 NOTE — Progress Notes (Signed)
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 who came in with symptoms of heart failure-she had been seen in the emergency room 12/5 and 04/29/2018 On first visit 12/5 she was seen in the emergency room--had a normal BNP normal troponin chest x-ray however concerning for cardiomegaly vascular congestion and she was given Lasix for 1 week and told to elevate her feet She completed treatment with this but then came back to the hospital as per my partner note   A & Plan  Acute diastolic heart failure-diurese gently with Lasix 20 mg IV as creatinine is tenuous and has bumped She will need close follow-up in outpatient setting education about fluid restriction salt restriction and good blood pressure control in addition to significant weight loss as this is probably the etiology behind her heart failure  New onset diabetes mellitus A1c 7.0-we will start Amaryl 2 mg as given her metformin may be slightly risky given renal insufficiency when diuresing-we will monitor trends-will need significant education going forward meter as well as outpatient consolidation  Body mass index is 62.59 kg/m.  Life-threatening and needs significant weight loss  Homeless-will be returning to shelter on discharge significant dynamics in the family and that needs to be addressed and sorted out-social worker input appreciated  Hypertension-continue metoprolol 12.5 twice daily may need to escalate treatment may add thiazide in a.m.   Lovenox no family present inpatient likely can discharge once she diuresis more and has a negative urinary balance  Mahala Menghini, MD  [facility] Direct contact: [phone] --Via amion app OR  --[email or web address]; password [email or web address]  7PM-7AM contact night coverage as above 05/16/2018, 4:42 PM  LOS: 1 day    Consultants:  No  Procedures:  No  Antimicrobials:  No  Interval history/Subjective: Feels fair interviewed with interpreter who is signing No chest pain no fever no chills feels a little less swollen Passing good urine surprised that her having diabetes  Objective:  Vitals:  Vitals:   05/16/18 0545 05/16/18 1529  BP: 137/70 114/68  Pulse: 84 74  Resp: 20 16  Temp: 98.2 F (36.8 C)   SpO2: 92% 92%    Exam:  Obese alert pleasant thick neck S1-S2 no murmur Chest clear Abdomen soft Neurologically intact power 5/5 moves all 4 extremities without deficit No hepatosplenomegaly but habitus precludes good exam Range of motion intact Psych euthymic   I have personally reviewed the following:  DATA   Labs:  Creatinine down from 13/1.2 to 20/1.0  Rest of labs are stable  -960 for the day I/O weight down from 1 67-1 65  Scheduled Meds: . furosemide  20 mg Intravenous Daily  . [START ON 05/17/2018] glimepiride  2 mg Oral Q breakfast  . insulin aspart  0-5 Units Subcutaneous QHS  . insulin aspart  0-9 Units Subcutaneous TID WC  . metoprolol tartrate  12.5 mg Oral BID  . nystatin   Topical BID  . sertraline  50 mg Oral Daily  . sodium chloride flush  3 mL Intravenous Q12H   Continuous Infusions: . sodium chloride      Principal Problem:   Acute CHF (congestive heart failure) (HCC) Active Problems:   Major depressive disorder, recurrent, severe without psychotic features (HCC)   Hypertension   Mild renal insufficiency   Hyperglycemia   Heart failure (HCC)   LOS: 1 day

## 2018-05-16 NOTE — Progress Notes (Signed)
HS blood sugar 156 per [place], NT.

## 2018-05-16 NOTE — Care Management Note (Signed)
Case Management Note  Patient Details  Name: NITZIA HALING MRN: [id number] Date of Birth: 01-Jul-1960  Subjective/Objective:SOB. From Homeless shelter-[facility]-CSW following.deaf-utilize Sign Presenter, broadcasting in rm. Follow for d/c needs.                   Action/Plan:d/c back to homeless shelter.   Expected Discharge Date:                  Expected Discharge Plan:  Home/Self Care  In-House Referral:     Discharge planning Services  CM Consult  Post Acute Care Choice:    Choice offered to:     DME Arranged:    DME Agency:     HH Arranged:    HH Agency:     Status of Service:  In process, will continue to follow  If discussed at Long Length of Stay Meetings, dates discussed:    Additional Comments:  Lanier Clam, RN 05/16/2018, 12:32 PM

## 2018-05-17 ENCOUNTER — Inpatient Hospital Stay (HOSPITAL_COMMUNITY): Payer: Medicare Other

## 2018-05-17 LAB — RENAL FUNCTION PANEL
Albumin: 3.3 g/dL — ABNORMAL LOW (ref 3.5–5.0)
Anion gap: 10 (ref 5–15)
BUN: 20 mg/dL (ref 6–20)
CALCIUM: 8.8 mg/dL — AB (ref 8.9–10.3)
CO2: 29 mmol/L (ref 22–32)
Chloride: 101 mmol/L (ref 98–111)
Creatinine, Ser: 1.08 mg/dL — ABNORMAL HIGH (ref 0.44–1.00)
GFR calc Af Amer: >60 mL/min (ref >60)
GFR calc non Af Amer: 57 mL/min — ABNORMAL LOW (ref >60)
Glucose, Bld: 213 mg/dL — ABNORMAL HIGH (ref 70–99)
Phosphorus: 4.1 mg/dL (ref 2.5–4.6)
Potassium: 3.8 mmol/L (ref 3.5–5.1)
Sodium: 140 mmol/L (ref 135–145)

## 2018-05-17 LAB — GLUCOSE, CAPILLARY
Glucose-Capillary: 175 mg/dL — ABNORMAL HIGH (ref 70–99)
Glucose-Capillary: 176 mg/dL — ABNORMAL HIGH (ref 70–99)
Glucose-Capillary: 111 mg/dL — ABNORMAL HIGH (ref 70–99)
Glucose-Capillary: 225 mg/dL — ABNORMAL HIGH (ref 70–99)

## 2018-05-17 MED ORDER — FUROSEMIDE 10 MG/ML IJ SOLN
20.0000 mg | Freq: Two times a day (BID) | INTRAMUSCULAR | Status: DC
  Administered 2018-05-17 – 2018-05-23 (×12): 20 mg via INTRAVENOUS
  Filled 2018-05-17 (×12): qty 2

## 2018-05-17 NOTE — Progress Notes (Addendum)
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 who came in with symptoms of heart failure-she had been seen in the emergency room 12/5 and 04/29/2018 On first visit 12/5 she was seen in the emergency room--had a normal BNP normal troponin chest x-ray however concerning for cardiomegaly vascular congestion and she was given Lasix for 1 week and told to elevate her feet She completed treatment with this but then came back to the hospital as per my partner note   A & Plan  Acute diastolic heart failure- 05/17/18 worsening of her symptoms, obtain chest x-ray, increase Lasix 20 IV twice daily carefully monitoring creatinine  New onset diabetes mellitus A1c 7.0-we will start Amaryl 2 mg as given her metformin may be slightly risky given renal insufficiency when diuresing-sugars ranging 170-215  Body mass index is 59.6 kg/m.  Life-threatening and needs significant weight loss  ?Ohss habitus-May need screening as an outpatient  Homeless-will be returning to shelter on discharge significant dynamics in the family and that needs to be addressed and sorted out-social worker input appreciated  Hypertension-continue metoprolol 12.5 twice daily-considering addition of other agents   Lovenox no family present inpatient likely can discharge once she diuresis more and has a negative urinary balance  Mahala Menghini, MD  [facility] Direct contact: [phone] --Via amion app OR  --[email or web address]; password [email or web address]  7PM-7AM contact night coverage as above 05/17/2018, 10:50 AM  LOS: 2 days   Consultants:  No  Procedures:  No  Antimicrobials:  No  Interval history/Subjective: Some worsening shortness of baseline shortness of breath overnight needing oxygen supplementary added-nursing reports has been wheezing as well no chest pain  Objective:  Vitals:  Vitals:   05/17/18 0600 05/17/18 0920  BP: (!) 111/56   Pulse: 63   Resp: 18   Temp: 98 F (36.7 C)   SpO2: 93% 93%    Exam:  Wake slightly sleepy no distress Chest wheezy with some bilateral but bibasilar posterior crackles Lower extremity edema stockings have not been found as yet Neurologically intact   I have personally reviewed the following:  DATA   Labs:  Creatinine down from 13/1.2 to 20/1.0  Rest of labs are stable  -960 for the day I/O weight down from 1 67-1 65  2 view x-ray continues to show cardiomegaly and vascular congestion  Scheduled Meds: . furosemide  20 mg Intravenous Daily  . glimepiride  2 mg Oral Q breakfast  . insulin aspart  0-5 Units Subcutaneous QHS  . insulin aspart  0-9 Units Subcutaneous TID WC  . metoprolol tartrate  12.5 mg Oral BID  . nystatin   Topical BID  . sertraline  50 mg Oral Daily  . sodium chloride flush  3 mL Intravenous Q12H   Continuous Infusions: . sodium chloride      Principal Problem:   Acute CHF (congestive heart failure) (HCC) Active Problems:   Major depressive disorder, recurrent, severe without psychotic features (HCC)   Hypertension   Mild renal insufficiency   Hyperglycemia   Heart failure (HCC)   LOS: 2 days

## 2018-05-18 LAB — CBC WITH DIFFERENTIAL/PLATELET
Abs Immature Granulocytes: 0.06 10*3/uL (ref 0.00–0.07)
Basophils Absolute: 0 10*3/uL (ref 0.0–0.1)
Basophils Relative: 0 %
Eosinophils Absolute: 0.1 10*3/uL (ref 0.0–0.5)
Eosinophils Relative: 1 %
HCT: 42.5 % (ref 36.0–46.0)
Hemoglobin: 12.7 g/dL (ref 12.0–15.0)
Immature Granulocytes: 1 %
LYMPHS PCT: 18 %
Lymphs Abs: 1.8 10*3/uL (ref 0.7–4.0)
MCH: 27 pg (ref 26.0–34.0)
MCHC: 29.9 g/dL — ABNORMAL LOW (ref 30.0–36.0)
MCV: 90.2 fL (ref 80.0–100.0)
Monocytes Absolute: 1.2 10*3/uL — ABNORMAL HIGH (ref 0.1–1.0)
Monocytes Relative: 12 %
Neutro Abs: 6.8 10*3/uL (ref 1.7–7.7)
Neutrophils Relative %: 68 %
Platelets: 238 10*3/uL (ref 150–400)
RBC: 4.71 MIL/uL (ref 3.87–5.11)
RDW: 15.4 % (ref 11.5–15.5)
WBC: 9.9 10*3/uL (ref 4.0–10.5)
nRBC: 0 % (ref 0.0–0.2)

## 2018-05-18 LAB — BASIC METABOLIC PANEL
Anion gap: 11 (ref 5–15)
BUN: 20 mg/dL (ref 6–20)
CALCIUM: 8.8 mg/dL — AB (ref 8.9–10.3)
CO2: 31 mmol/L (ref 22–32)
Chloride: 98 mmol/L (ref 98–111)
Creatinine, Ser: 1.13 mg/dL — ABNORMAL HIGH (ref 0.44–1.00)
GFR calc Af Amer: >60 mL/min (ref >60)
GFR calc non Af Amer: 54 mL/min — ABNORMAL LOW (ref >60)
Glucose, Bld: 187 mg/dL — ABNORMAL HIGH (ref 70–99)
Potassium: 3.8 mmol/L (ref 3.5–5.1)
Sodium: 140 mmol/L (ref 135–145)

## 2018-05-18 LAB — GLUCOSE, CAPILLARY
Glucose-Capillary: 162 mg/dL — ABNORMAL HIGH (ref 70–99)
Glucose-Capillary: 178 mg/dL — ABNORMAL HIGH (ref 70–99)
Glucose-Capillary: 107 mg/dL — ABNORMAL HIGH (ref 70–99)
Glucose-Capillary: 162 mg/dL — ABNORMAL HIGH (ref 70–99)

## 2018-05-18 NOTE — Care Management Note (Signed)
Case Management Note  Patient Details  Name: [name] MRN: [id number] Date of Birth: 26-May-1960  Subjective/Objective:    CHF, DM, HTN                Action/Plan: NCM spoke to pt and Daci, Arnt # [phone] at bedside. Pt's dtr was able to do sign language with pt. Pt is interested in SNF rehab. And dtr wanted information on ALF. CSW referral for SNF. Waiting PT/OT recommendations.   Expected Discharge Date:                  Expected Discharge Plan:  Skilled Nursing Facility  In-House Referral:  Clinical Social Work  Discharge planning Services  CM Consult  Post Acute Care Choice:  NA Choice offered to:  NA  DME Arranged:  N/A DME Agency:  NA  HH Arranged:  NA HH Agency:  NA  Status of Service:  Completed, signed off  If discussed at Long Length of Stay Meetings, dates discussed:    Additional Comments:  Elliot Cousin, RN 05/18/2018, 4:37 PM

## 2018-05-18 NOTE — Progress Notes (Signed)
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 who came in with symptoms of heart failure-she had been seen in the emergency room 12/5 and 04/29/2018 On first visit 12/5 she was seen in the emergency room--had a normal BNP normal troponin chest x-ray however concerning for cardiomegaly vascular congestion and she was given Lasix for 1 week and told to elevate her feet She completed treatment with this but then came back to the hospital as per my partner note   A & Plan  Acute diastolic heart failure-  -5 L but somewhat inaccurate, weights also inaccurate  Requiring oxygen at times-desat screen and ambulate  We will ask therapy to evaluate for appropriateness of discharge  New onset diabetes mellitus A1c 7.0-  Continue Amaryl 2 mg g-sugars ranging 160-180  Body mass index is 61.38 kg/m.    Life-threatening and needs significant weight loss  ?Ohss  habitus with probable restrictive component secondary to morbid obesity-May need screening as an outpatient  Hypertension-continue metoprolol 12.5 twice daily-considering addition of other agents   Homeless impacting safe discharge plan family showed up today it appears sister is willing to take the patient into her home and there appear to be some dynamics which we did not explore but it sounds like patient was not doing what she was supposed to do while she was at her sister's house Case management is to see the patient in consult And then we can determine a safe plan as she may need oxygen continue monitoring diuretics etc. and I will asked therapy to see her for appropriateness of discharge  Lovenox-see above-full code  Mahala MenghiniSamtani, MD  [facility] Direct contact: [phone] --Via amion app OR  --[email or web address]; password [email or web address]  7PM-7AM contact night coverage as above 05/18/2018, 3:14 PM  LOS: 3 days    Consultants:  No  Procedures:  No  Antimicrobials:  No  Interval history/Subjective: Some worsening shortness of baseline shortness of breath overnight needing oxygen supplementary added-nursing reports has been wheezing as well no chest pain  Objective:  Vitals:  Vitals:   05/18/18 0606 05/18/18 1413  BP: 119/75 115/65  Pulse: 80 78  Resp: 18 14  Temp: 98.3 F (36.8 C) 99.1 F (37.3 C)  SpO2: 93% 92%    Exam:  Awake alert no distress Chest has mild crackles posterolaterally however she is now on oxygen S1-S2 no murmur Abdomen is morbidly obese nontender nondistended Stockings on both lower extremities   I have personally reviewed the following:  DATA   Labs:  Creatinine down from 13/1.2 to 20/1.1  Rest of labs are stable  Weights do not seem accurate however -5.1 L so far but she is not passing urine by pure wick alone  2 view x-ray continues to show cardiomegaly and vascular congestion  Scheduled Meds: . furosemide  20 mg Intravenous BID  . glimepiride  2 mg Oral Q breakfast  . insulin aspart  0-5 Units Subcutaneous QHS  . insulin aspart  0-9 Units Subcutaneous TID WC  . metoprolol tartrate  12.5 mg Oral BID  . nystatin   Topical BID  . sertraline  50 mg Oral Daily  . sodium chloride flush  3 mL Intravenous Q12H   Continuous Infusions: . sodium chloride      Principal Problem:   Acute CHF (congestive heart failure) (HCC) Active Problems:   Major depressive disorder, recurrent, severe without psychotic features (HCC)  Hypertension   Mild renal insufficiency   Hyperglycemia   Heart failure (HCC)   LOS: 3 days

## 2018-05-19 ENCOUNTER — Inpatient Hospital Stay (HOSPITAL_COMMUNITY): Payer: Medicare Other

## 2018-05-19 LAB — RENAL FUNCTION PANEL
ANION GAP: 12 (ref 5–15)
Albumin: 3.3 g/dL — ABNORMAL LOW (ref 3.5–5.0)
BUN: 20 mg/dL (ref 6–20)
CHLORIDE: 95 mmol/L — AB (ref 98–111)
CO2: 30 mmol/L (ref 22–32)
Calcium: 8.6 mg/dL — ABNORMAL LOW (ref 8.9–10.3)
Creatinine, Ser: 1.2 mg/dL — ABNORMAL HIGH (ref 0.44–1.00)
GFR calc Af Amer: 58 mL/min — ABNORMAL LOW (ref >60)
GFR calc non Af Amer: 50 mL/min — ABNORMAL LOW (ref >60)
Glucose, Bld: 189 mg/dL — ABNORMAL HIGH (ref 70–99)
Phosphorus: 3.3 mg/dL (ref 2.5–4.6)
Potassium: 3.7 mmol/L (ref 3.5–5.1)
Sodium: 137 mmol/L (ref 135–145)

## 2018-05-19 LAB — GLUCOSE, CAPILLARY
Glucose-Capillary: 166 mg/dL — ABNORMAL HIGH (ref 70–99)
Glucose-Capillary: 195 mg/dL — ABNORMAL HIGH (ref 70–99)
Glucose-Capillary: 118 mg/dL — ABNORMAL HIGH (ref 70–99)
Glucose-Capillary: 160 mg/dL — ABNORMAL HIGH (ref 70–99)

## 2018-05-19 MED ORDER — GUAIFENESIN 100 MG/5ML PO SOLN
5.0000 mL | ORAL | Status: DC | PRN
  Administered 2018-05-19 – 2018-05-23 (×15): 100 mg via ORAL
  Filled 2018-05-19 (×15): qty 10

## 2018-05-19 MED ORDER — ACETAMINOPHEN 325 MG PO TABS: 650.0000 mg | ORAL_TABLET | ORAL | Status: DC | PRN

## 2018-05-19 NOTE — Progress Notes (Signed)
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 who came in with symptoms of heart failure-she had been seen in the emergency room 12/5 and 04/29/2018 On first visit 12/5 she was seen in the emergency room--had a normal BNP normal troponin chest x-ray however concerning for cardiomegaly vascular congestion and she was given Lasix for 1 week and told to elevate her feet She completed treatment with this but then came back to the hospital as per my partner note   A & Plan  Acute diastolic heart failure-  Requiring oxygen at times-desat screen and ambulate Will require ongoing education as probably not compliant with fluid restriction coughing feels short of breath today and still evidences of volume overload  New onset diabetes mellitus A1c 7.0-  Continue Amaryl 2 mg g-CBG 161-190  Body mass index is 60.78 kg/m.    Life-threatening and needs significant weight loss  ?Ohss  habitus with probable restrictive component secondary to morbid obesity-May need screening as an outpatient  Hypertension continue metoprolol 12.5 twice daily-considering addition of other agents   Homeless impacting safe discharge plan family showed up 05/18/2018 we had a brief discussion Case management has been involved social work will see for further management but appears will need to go to skilled facility--she has new onset heart failure diabetes and probably has OSA and requires a lot of care coordination and a safe discharge plan  Lovenox-see above-full code  April Menghini, MD  [facility] Direct contact: [phone] --Via amion app OR  --[email or web address]; password [email or web address]  7PM-7AM contact night coverage as above 05/19/2018, 1:56 PM  LOS: 4 days   Consultants:  No  Procedures:  No  Antimicrobials:  No  Interval history/Subjective:  Coughing and wheezing asking if she  can get medication for this Eating and drinking okay does need significant assistance moving around No fever no chills  Objective:  Vitals:  Vitals:   05/19/18 0900 05/19/18 1012  BP:    Pulse:    Resp:    Temp:    SpO2: 91% 91%    Exam:  Awake alert no distress Chest no crackles no rales S1-S2 no murmur Abdomen is morbidly obese nontender nondistended Stockings on both lower extremities   I have personally reviewed the following:  DATA   Labs:  Creatinine down from 13/1.2 to 20/1.1-->20/1.2  Rest of labs are stable  167-160 currently  -950 cc only  2 view x-ray repeated on 1/6 shows unchanged pulmonary vascular congestion cardiomegaly  Scheduled Meds: . furosemide  20 mg Intravenous BID  . glimepiride  2 mg Oral Q breakfast  . insulin aspart  0-5 Units Subcutaneous QHS  . insulin aspart  0-9 Units Subcutaneous TID WC  . metoprolol tartrate  12.5 mg Oral BID  . nystatin   Topical BID  . sertraline  50 mg Oral Daily  . sodium chloride flush  3 mL Intravenous Q12H   Continuous Infusions: . sodium chloride      Principal Problem:   Acute CHF (congestive heart failure) (HCC) Active Problems:   Major depressive disorder, recurrent, severe without psychotic features (HCC)   Hypertension   Mild renal insufficiency   Hyperglycemia   Heart failure (HCC)   LOS: 4 days

## 2018-05-19 NOTE — Care Management Important Message (Signed)
Important Message  Patient Details  Name: [name] MRN: [id number] Date of Birth: 02-23-1961   Medicare Important Message Given:  Yes    Caren Macadam 05/19/2018, 2:18 PMImportant Message  Patient Details  Name: [name] MRN: [id number] Date of Birth: 1961/04/28   Medicare Important Message Given:  Yes    Caren Macadam 05/19/2018, 2:18 PM

## 2018-05-19 NOTE — Evaluation (Signed)
Physical Therapy Evaluation Patient Details Name: April RuddleCheryl R Drake MRN: [id number] DOB: [birth year] Today's Date: 05/19/2018   History of Present Illness  58 yo female admitted with acute CHF and new onset of DM. Hx of hearing impairment/deaf, chronic R knee pain, depression.  Clinical Impression  On eval, pt required Min assist for mobility. She walked ~50 feet with use of a RW. Gait was antalgic throughout distance due to R knee pain. O2 sat 89-90% on RA at rest, 87-88% on RA during ambulation, 91% on 2L [place] during ambulation. Discussed d/c plan-pt is agreeable to ST rehab.    Follow Up Recommendations SNF    Equipment Recommendations  None recommended by PT    Recommendations for Other Services       Precautions / Restrictions Precautions Precautions: Fall Precaution Comments: monitor O2 Restrictions Weight Bearing Restrictions: No      Mobility  Bed Mobility Overal bed mobility: Needs Assistance Bed Mobility: Supine to Sit;Sit to Supine     Supine to sit: Supervision Sit to supine: Supervision   General bed mobility comments: for lines, safety. Increased time. Pt uses momentum to get to sitting from supine  Transfers Overall transfer level: Needs assistance Equipment used: Rolling walker (2 wheeled) Transfers: Sit to/from Stand Sit to Stand: Min assist         General transfer comment: Assist to rise, stabilize, control descent. VCs safety, hand placement.   Ambulation/Gait Ambulation/Gait assistance: Min assist Gait Distance (Feet): 50 Feet Assistive device: Rolling walker (2 wheeled) Gait Pattern/deviations: Antalgic     General Gait Details: Assist to stabilize intermittently. Distance limited by fatigue, knee pain.   Stairs            Wheelchair Mobility    Modified Rankin (Stroke Patients Only)       Balance Overall balance assessment: Needs assistance           Standing balance-Leahy Scale: Fair                                Pertinent Vitals/Pain Pain Assessment: Faces Faces Pain Scale: Hurts even more Pain Location: R knee with activity Pain Descriptors / Indicators: Guarding;Discomfort Pain Intervention(s): Limited activity within patient's tolerance;Repositioned    Home Living Family/patient expects to be discharged to:: Skilled nursing facility     Type of Home: Homeless(shelter)         Home Equipment: Gilmer Morane - single point      Prior Function Level of Independence: Independent with assistive device(s)         Comments: uses cane     Hand Dominance        Extremity/Trunk Assessment   Upper Extremity Assessment Upper Extremity Assessment: Overall WFL for tasks assessed    Lower Extremity Assessment Lower Extremity Assessment: Generalized weakness    Cervical / Trunk Assessment Cervical / Trunk Assessment: Normal  Communication   Communication: Deaf  Cognition Arousal/Alertness: Awake/alert Behavior During Therapy: WFL for tasks assessed/performed Overall Cognitive Status: Within Functional Limits for tasks assessed                                        General Comments      Exercises     Assessment/Plan    PT Assessment Patient needs continued PT services  PT Problem List Decreased strength;Decreased balance;Decreased activity tolerance;Decreased mobility;Pain;Decreased  knowledge of use of DME       PT Treatment Interventions DME instruction;Gait training;Functional mobility training;Therapeutic activities;Balance training;Patient/family education;Therapeutic exercise    PT Goals (Current goals can be found in the Care Plan section)  Acute Rehab PT Goals Patient Stated Goal: none stated PT Goal Formulation: With patient Time For Goal Achievement: 06/02/18 Potential to Achieve Goals: Good    Frequency Min 2X/week   Barriers to discharge        Co-evaluation               AM-PAC PT "6 Clicks" Mobility  Outcome Measure  Help needed turning from your back to your side while in a flat bed without using bedrails?: A Little Help needed moving from lying on your back to sitting on the side of a flat bed without using bedrails?: A Little Help needed moving to and from a bed to a chair (including a wheelchair)?: A Little Help needed standing up from a chair using your arms (e.g., wheelchair or bedside chair)?: A Little Help needed to walk in hospital room?: A Little Help needed climbing 3-5 steps with a railing? : A Lot 6 Click Score: 17    End of Session   Activity Tolerance: Patient limited by fatigue;Patient limited by pain Patient left: in bed;with call bell/phone within reach   PT Visit Diagnosis: Muscle weakness (generalized) (M62.81);Difficulty in walking, not elsewhere classified (R26.2);Pain Pain - Right/Left: Right Pain - part of body: Knee    Time: [id number] PT Time Calculation (min) (ACUTE ONLY): 14 min   Charges:   PT Evaluation $PT Eval Moderate Complexity: 1 Mod            Rebeca AlertJannie Dilyn Smiles, PT Acute Rehabilitation Services Pager: [phone] Office: [phone]

## 2018-05-19 NOTE — Care Management Note (Signed)
Case Management Note  Patient Details  Name: [name] MRN: [id number] Date of Birth: Jul 11, 1960  Subjective/Objective: PT recc SNF-CSW following.                   Action/Plan:dc SNF.   Expected Discharge Date:                  Expected Discharge Plan:  Skilled Nursing Facility  In-House Referral:  Clinical Social Work  Discharge planning Services  CM Consult  Post Acute Care Choice:  NA Choice offered to:  NA  DME Arranged:  N/A DME Agency:  NA  HH Arranged:  NA HH Agency:  NA  Status of Service:  Completed, signed off  If discussed at Long Length of Stay Meetings, dates discussed:    Additional Comments:  Lanier Clam, RN 05/19/2018, 12:55 PM

## 2018-05-20 LAB — TSH: TSH: 3.052 u[IU]/mL (ref 0.350–4.500)

## 2018-05-20 LAB — CBC WITH DIFFERENTIAL/PLATELET
Abs Immature Granulocytes: 0.05 10*3/uL (ref 0.00–0.07)
Basophils Absolute: 0 10*3/uL (ref 0.0–0.1)
Basophils Relative: 0 %
EOS ABS: 0.1 10*3/uL (ref 0.0–0.5)
EOS PCT: 1 %
HCT: 43.5 % (ref 36.0–46.0)
Hemoglobin: 12.9 g/dL (ref 12.0–15.0)
Immature Granulocytes: 1 %
LYMPHS ABS: 1.5 10*3/uL (ref 0.7–4.0)
Lymphocytes Relative: 16 %
MCH: 26.5 pg (ref 26.0–34.0)
MCHC: 29.7 g/dL — ABNORMAL LOW (ref 30.0–36.0)
MCV: 89.3 fL (ref 80.0–100.0)
Monocytes Absolute: 1.2 10*3/uL — ABNORMAL HIGH (ref 0.1–1.0)
Monocytes Relative: 12 %
NRBC: 0 % (ref 0.0–0.2)
Neutro Abs: 6.6 10*3/uL (ref 1.7–7.7)
Neutrophils Relative %: 70 %
Platelets: 300 10*3/uL (ref 150–400)
RBC: 4.87 MIL/uL (ref 3.87–5.11)
RDW: 15 % (ref 11.5–15.5)
WBC: 9.5 10*3/uL (ref 4.0–10.5)

## 2018-05-20 LAB — BASIC METABOLIC PANEL
Anion gap: 12 (ref 5–15)
BUN: 21 mg/dL — ABNORMAL HIGH (ref 6–20)
CO2: 28 mmol/L (ref 22–32)
Calcium: 8.9 mg/dL (ref 8.9–10.3)
Chloride: 97 mmol/L — ABNORMAL LOW (ref 98–111)
Creatinine, Ser: 1.08 mg/dL — ABNORMAL HIGH (ref 0.44–1.00)
GFR calc Af Amer: >60 mL/min (ref >60)
GFR, EST NON AFRICAN AMERICAN: 57 mL/min — AB (ref >60)
Glucose, Bld: 179 mg/dL — ABNORMAL HIGH (ref 70–99)
Potassium: 3.7 mmol/L (ref 3.5–5.1)
Sodium: 137 mmol/L (ref 135–145)

## 2018-05-20 LAB — GLUCOSE, CAPILLARY
Glucose-Capillary: 163 mg/dL — ABNORMAL HIGH (ref 70–99)
Glucose-Capillary: 221 mg/dL — ABNORMAL HIGH (ref 70–99)
Glucose-Capillary: 178 mg/dL — ABNORMAL HIGH (ref 70–99)
Glucose-Capillary: 184 mg/dL — ABNORMAL HIGH (ref 70–99)

## 2018-05-20 MED ORDER — IPRATROPIUM-ALBUTEROL 0.5-2.5 (3) MG/3ML IN SOLN
3.0000 mL | Freq: Three times a day (TID) | RESPIRATORY_TRACT | Status: DC
  Administered 2018-05-20 – 2018-05-22 (×8): 3 mL via RESPIRATORY_TRACT
  Filled 2018-05-20 (×10): qty 3

## 2018-05-20 MED ORDER — SPIRONOLACTONE 12.5 MG HALF TABLET
12.5000 mg | ORAL_TABLET | Freq: Every day | ORAL | Status: DC
  Administered 2018-05-20 – 2018-05-23 (×4): 12.5 mg via ORAL
  Filled 2018-05-20 (×4): qty 1

## 2018-05-20 NOTE — Clinical Social Work Note (Signed)
Clinical Social Work Assessment  Patient Details  Name: [name] MRN: [id number] Date of Birth: 11-14-1960  Date of referral:  05/20/18               Reason for consult:  Facility Placement, Discharge Planning                Permission sought to share information with:  Family Supports Permission granted to share information::  Yes, Verbal Permission Granted  Name::     [name]  Agency::     Relationship::  daughter  Contact Information:  [phone]  Housing/Transportation Living arrangements for the past 2 months:  [place] of Information:  Patient, Adult Children Patient Interpreter Needed:  Sign Language Criminal Activity/Legal Involvement Pertinent to Current Situation/Hospitalization:  No - Comment as needed Significant Relationships:  Adult Children, Siblings Lives with:  Self Do you feel safe going back to the place where you live?  Yes Need for family participation in patient care:  Yes (Comment)  Care giving concerns:  Patient has support from adult daughter and sister who both live in the area. During patients assessment with CSW patient daughter was present and interpreted for CSW. Patient was agreeable for patients daughter to signed for patient while CSW spoke to both pt and daughter   Social Worker assessment / plan:  CSW met patient and daughter [name] at bedside. CSW went over the process of SNF placement and answered question both patient and daughter had. Daughter [name] explained to [facility] why patient had been homeless for 6 months. [name] stated that for the past several years patient had been moving from house to house and at some point pt was living with daughter. [name] stated that due to patient depression she was unable to handle taking care of herself and her bills and stopped going to work. [name] stated that patient does have an income and is pulling in a little over 2 grand a month but patient is still unable to get an  apartment on her own due to poor credit. Patient has both Medicare and Medicaid. Family stated they will start looking into ALF or low income housing to assist patient in finding stable housing after rehab. CSW offered to get them resources to assist with decision. CSW will continue to follow patient for support and discharge needs  Employment status:  Retired Forensic scientist:  Medicare PT Recommendations:  [place] / Referral to community resources:  [facility]  Patient/Family's Response to care:  Family appreciative CSW in care  Patient/Family's Understanding of and Emotional Response to Diagnosis, Current Treatment, and Prognosis:  Patient and daughter aware that patient will need a long term plan in place after patient finish rehab at a facility   Emotional Assessment Appearance:  Appears stated age Attitude/Demeanor/Rapport:  Engaged Affect (typically observed):  Accepting, Pleasant Orientation:  Oriented to Self, Oriented to Place, Oriented to  Time, Oriented to Situation Alcohol / Substance use:  Not Applicable Psych involvement (Current and /or in the community):  No (Comment)  Discharge Needs  Concerns to be addressed:  Care Coordination Readmission within the last 30 days:  Yes Current discharge risk:  Dependent with Mobility Barriers to Discharge:  Continued Medical Work up   [employer], [place] 05/20/2018, 5:25 PM

## 2018-05-20 NOTE — Progress Notes (Signed)
Attempted to wean to room air.  Sat 91% on 1L [place], decreased to 87% on [place].  Reapplied oxygen. [name]

## 2018-05-20 NOTE — Progress Notes (Signed)
Physical Therapy Treatment Patient Details Name: [name] MRN: [id number] DOB: Feb 21, 1961 Today's Date: 05/20/2018    History of Present Illness 58 yo female admitted with acute CHF and new onset of DM. Hx of hearing impairment/deaf, chronic R knee pain, depression.    PT Comments    Pt continues to participate well. O2 sat 89% on 1L [place] at rest; 93% on 2L [place]. Continue to recommend SNF.   Follow Up Recommendations  SNF     Equipment Recommendations  None recommended by PT    Recommendations for Other Services       Precautions / Restrictions Precautions Precautions: Fall Precaution Comments: monitor O2 Restrictions Weight Bearing Restrictions: No    Mobility  Bed Mobility               General bed mobility comments: oob in chair  Transfers Overall transfer level: Needs assistance Equipment used: Rolling walker (2 wheeled) Transfers: Sit to/from Stand Sit to Stand: Min guard         General transfer comment: Increased time. VCs safety, hand placement.   Ambulation/Gait Ambulation/Gait assistance: Min assist Gait Distance (Feet): 55 Feet Assistive device: Rolling walker (2 wheeled) Gait Pattern/deviations: Step-through pattern;Antalgic     General Gait Details: Assist to stabilize intermittently. Distance limited by fatigue, knee pain. Remained on [place] O2-sats 91% on 1L   Stairs             Wheelchair Mobility    Modified Rankin (Stroke Patients Only)       Balance Overall balance assessment: Needs assistance           Standing balance-Leahy Scale: Fair                              Cognition Arousal/Alertness: Awake/alert Behavior During Therapy: WFL for tasks assessed/performed Overall Cognitive Status: Within Functional Limits for tasks assessed                                        Exercises      General Comments        Pertinent Vitals/Pain Pain Assessment: Faces Faces Pain Scale:  Hurts little more Pain Location: R knee with activity Pain Descriptors / Indicators: Guarding;Discomfort Pain Intervention(s): Limited activity within patient's tolerance;Repositioned    Home Living                      Prior Function            PT Goals (current goals can now be found in the care plan section) Progress towards PT goals: Progressing toward goals    Frequency    Min 2X/week      PT Plan Current plan remains appropriate    Co-evaluation              AM-PAC PT "6 Clicks" Mobility   Outcome Measure  Help needed turning from your back to your side while in a flat bed without using bedrails?: A Little Help needed moving from lying on your back to sitting on the side of a flat bed without using bedrails?: A Little Help needed moving to and from a bed to a chair (including a wheelchair)?: A Little Help needed standing up from a chair using your arms (e.g., wheelchair or bedside chair)?: A Little Help needed to walk in  hospital room?: A Little Help needed climbing 3-5 steps with a railing? : A Little 6 Click Score: 18    End of Session Equipment Utilized During Treatment: Gait belt;Oxygen Activity Tolerance: Patient limited by fatigue Patient left: in chair;with call bell/phone within reach   PT Visit Diagnosis: Muscle weakness (generalized) (M62.81);Difficulty in walking, not elsewhere classified (R26.2);Pain;Unsteadiness on feet (R26.81) Pain - Right/Left: Right Pain - part of body: Knee     Time: 3202-3343 PT Time Calculation (min) (ACUTE ONLY): 10 min  Charges:  $Gait Training: 8-22 mins                        Rebeca Alert, PT Acute Rehabilitation Services Pager: [phone] Office: [phone]

## 2018-05-20 NOTE — Progress Notes (Signed)
[facility] PROGRESS NOTE  April RuddleCheryl R Drake ZOX:[id number] DOB: 24-Apr-1961 DOA: 05/14/2018 PCP: No primary care provider on file.   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 who came in with symptoms of heart failure-she had been seen in the emergency room 12/5 and 04/29/2018 On first visit 12/5 she was seen in the emergency room--had a normal BNP normal troponin chest x-ray however concerning for cardiomegaly vascular congestion and she was given Lasix for 1 week and told to elevate her feet She completed treatment with this but then came back to the hospital as per my partner note  A & Plan  Acute diastolic heart failure-but with differential probable cirrhosis ?diag 2008 with cirrhosis-no further work-up at that time-check PSA G and HCV Continue Lasix 20 IV twice daily, adding small dose of Aldactone to help mobilize further fluid  Tells me that weight over the past year has been 380 and is currently 355 pounds but I suspect she will need diuresis and increase in diuretics over the next several days probably can discharge end of the week New onset diabetes mellitus A1c 7.0-  Continue Amaryl 2 mg g-CBG: 160-220 range Body mass index is 60.85 kg/m.    Life-threatening and needs significant weight loss ?Ohss  habitus with probable restrictive component --is stable at this time Hypertension continue metoprolol 12.5 twice daily-considering addition of other agents Homeless impacting safe discharge plan   Lovenox-see above-full code Called and discussed with daughter Plan of care and need for further disposition help-her belongings are still at the homeless shelter and will need to be retrieved if she goes to a skilled facility  Mahala MenghiniSamtani, MD  [facility] Direct contact: [phone] --Via amion app OR  --[email or web address]; password [email or web address]  7PM-7AM contact night coverage as above 05/20/2018, 2:29 PM  LOS: 5 days    Consultants:  No  Procedures:  No  Antimicrobials:  No  Interval history/Subjective: Awake Was not oriented no distress Continued cough no face pain no headache no chills no fever   Objective:  Vitals:  Vitals:   05/20/18 1308 05/20/18 1401  BP: 111/61   Pulse: 75   Resp: (!) 24   Temp: 98.7 F (37.1 C)   SpO2: 93% 90%    Exam:  Thick neck Mallampati 4 EOMI NCAT Chest clear without added sound no rales no rhonchi no tenderness over frontal or maxillary sinuses Abdomen is obese she has anasarca Neurologically intact coherent making sense Musculoskeletal intact  I have personally reviewed the following:  DATA   Labs:  Creatinine stable 21/1.0, chloride down to 97  Weight down from 3 69-3 55  -7 L  Scheduled Meds: . furosemide  20 mg Intravenous BID  . glimepiride  2 mg Oral Q breakfast  . insulin aspart  0-5 Units Subcutaneous QHS  . insulin aspart  0-9 Units Subcutaneous TID WC  . ipratropium-albuterol  3 mL Nebulization TID  . metoprolol tartrate  12.5 mg Oral BID  . nystatin   Topical BID  . sertraline  50 mg Oral Daily  . sodium chloride flush  3 mL Intravenous Q12H  . spironolactone  12.5 mg Oral Daily   Continuous Infusions: . sodium chloride      Principal Problem:   Acute CHF (congestive heart failure) (HCC) Active Problems:   Major depressive disorder, recurrent, severe without psychotic features (HCC)   Hypertension   Mild renal insufficiency   Hyperglycemia   Heart failure (HCC)   LOS: 5 days

## 2018-05-21 LAB — CBC WITH DIFFERENTIAL/PLATELET
Abs Immature Granulocytes: 0.05 10*3/uL (ref 0.00–0.07)
BASOS ABS: 0 10*3/uL (ref 0.0–0.1)
Basophils Relative: 0 %
Eosinophils Absolute: 0.1 10*3/uL (ref 0.0–0.5)
Eosinophils Relative: 1 %
HCT: 42.2 % (ref 36.0–46.0)
Hemoglobin: 12.7 g/dL (ref 12.0–15.0)
Immature Granulocytes: 1 %
Lymphocytes Relative: 14 %
Lymphs Abs: 1.4 10*3/uL (ref 0.7–4.0)
MCH: 26.3 pg (ref 26.0–34.0)
MCHC: 30.1 g/dL (ref 30.0–36.0)
MCV: 87.6 fL (ref 80.0–100.0)
Monocytes Absolute: 1.3 10*3/uL — ABNORMAL HIGH (ref 0.1–1.0)
Monocytes Relative: 14 %
NRBC: 0 % (ref 0.0–0.2)
Neutro Abs: 6.7 10*3/uL (ref 1.7–7.7)
Neutrophils Relative %: 70 %
Platelets: 302 10*3/uL (ref 150–400)
RBC: 4.82 MIL/uL (ref 3.87–5.11)
RDW: 14.8 % (ref 11.5–15.5)
WBC: 9.5 10*3/uL (ref 4.0–10.5)

## 2018-05-21 LAB — COMPREHENSIVE METABOLIC PANEL
ALT: 14 U/L (ref 0–44)
AST: 11 U/L — ABNORMAL LOW (ref 15–41)
Albumin: 3.3 g/dL — ABNORMAL LOW (ref 3.5–5.0)
Alkaline Phosphatase: 66 U/L (ref 38–126)
Anion gap: 11 (ref 5–15)
BUN: 23 mg/dL — ABNORMAL HIGH (ref 6–20)
CO2: 30 mmol/L (ref 22–32)
Calcium: 9.1 mg/dL (ref 8.9–10.3)
Chloride: 96 mmol/L — ABNORMAL LOW (ref 98–111)
Creatinine, Ser: 1.1 mg/dL — ABNORMAL HIGH (ref 0.44–1.00)
GFR calc non Af Amer: 56 mL/min — ABNORMAL LOW (ref >60)
Glucose, Bld: 181 mg/dL — ABNORMAL HIGH (ref 70–99)
Potassium: 3.6 mmol/L (ref 3.5–5.1)
Sodium: 137 mmol/L (ref 135–145)
Total Bilirubin: 0.7 mg/dL (ref 0.3–1.2)
Total Protein: 7.5 g/dL (ref 6.5–8.1)

## 2018-05-21 LAB — HCV INTERPRETATION

## 2018-05-21 LAB — HEPATITIS B SURFACE ANTIGEN: Hepatitis B Surface Ag: NEGATIVE

## 2018-05-21 LAB — GLUCOSE, CAPILLARY: Glucose-Capillary: 161 mg/dL — ABNORMAL HIGH (ref 70–99)

## 2018-05-21 LAB — HCV AB W REFLEX TO QUANT PCR: HCV AB: 0.2 {s_co_ratio} (ref 0.0–0.9)

## 2018-05-21 NOTE — Progress Notes (Signed)
NUTRITION NOTE  Consult received for low Na diet education. This RD saw patient on 1/3 for the same and also discussed carbohydrates as they relate to CBGs at that time. Patient had reported emotional eating and this was discussed in some detail, but patient would likely benefit from multi-disciplinary discussions on this topic.   Talked with MD who reports no further RD interventions are needed at this time. If needs arise, please re-consult.   Trenton Gammon, MS, RD, LDN, [facility] Inpatient Clinical Dietitian Pager # [phone] After hours/weekend pager # [phone]

## 2018-05-21 NOTE — NC FL2 (Signed)
[place] MEDICAID FL2 LEVEL OF CARE SCREENING TOOL     IDENTIFICATION  Patient Name: [name] Birthdate: December 22, 1960 Sex: female Admission Date (Current Location): 05/14/2018  [facility] and [place] Number:  Producer, television/film/video and Address:  [facility],  [address] [place] [address]      Provider Number: [id number]  Attending Physician Name and Address:  [name], MD  Relative Name and Phone Number:  Jasminn Cones, [phone]    Current Level of Care: Hospital Recommended Level of Care: Skilled Nursing Facility Prior Approval Number:    Date Approved/Denied:   PASRR Number: pending  Discharge Plan: SNF    Current Diagnoses: Patient Active Problem List   Diagnosis Date Noted  . Acute CHF (congestive heart failure) (HCC) 05/15/2018  . Hypertension 05/15/2018  . Mild renal insufficiency 05/15/2018  . Hyperglycemia 05/15/2018  . Heart failure (HCC) 05/15/2018  . Major depressive disorder, recurrent, severe without psychotic features (HCC)   . Recurrent major depression-severe (HCC) 12/09/2014  . Difficulty hearing 03/02/2014    Orientation RESPIRATION BLADDER Height & Weight     Self, Time, Situation, Place  O2 External catheter, Continent Weight: (!) 349 lb 6.9 oz (158.5 kg) Height:  5\' 4"  (162.6 cm)  BEHAVIORAL SYMPTOMS/MOOD NEUROLOGICAL BOWEL NUTRITION STATUS      Continent Diet(heart healthy)  AMBULATORY STATUS COMMUNICATION OF NEEDS Skin   Limited Assist Verbally(patient is deaf and use sign language to communicate) Normal                       Personal Care Assistance Level of Assistance  Bathing, Feeding, Dressing Bathing Assistance: Limited assistance Feeding assistance: Independent Dressing Assistance: Limited assistance     Functional Limitations Info  Sight, Hearing, Speech Sight Info: Impaired(wear glasses) Hearing Info: Impaired(pt is deaf) Speech Info: Impaired(pt is needing to use sign  language to communicate)    SPECIAL CARE FACTORS FREQUENCY  PT (By licensed PT), OT (By licensed OT)     PT Frequency: 5x wk OT Frequency: 5x wk            Contractures Contractures Info: Not present    Additional Factors Info  Code Status Code Status Info: full code             Current Medications (05/21/2018):  This is the current hospital active medication list Current Facility-Administered Medications  Medication Dose Route Frequency Provider Last Rate Last Dose  . 0.9 %  sodium chloride infusion  250 mL Intravenous PRN Opyd, Lavone Neri, MD      . albuterol (PROVENTIL) (2.5 MG/3ML) 0.083% nebulizer solution 2.5 mg  2.5 mg Nebulization Q4H PRN Opyd, Lavone Neri, MD   2.5 mg at 05/20/18 0549  . furosemide (LASIX) injection 20 mg  20 mg Intravenous BID Rhetta Mura, MD   20 mg at 05/21/18 0855  . glimepiride (AMARYL) tablet 2 mg  2 mg Oral Q breakfast Rhetta Mura, MD   2 mg at 05/21/18 0855  . guaiFENesin (ROBITUSSIN) 100 MG/5ML solution 100 mg  5 mL Oral Q4H PRN Rhetta Mura, MD   100 mg at 05/21/18 0731  . insulin aspart (novoLOG) injection 0-5 Units  0-5 Units Subcutaneous QHS Briscoe Deutscher, MD   2 Units at 05/17/18 2135  . insulin aspart (novoLOG) injection 0-9 Units  0-9 Units Subcutaneous TID WC Opyd, Lavone Neri, MD   2 Units at 05/21/18 0845  . ipratropium-albuterol (DUONEB) 0.5-2.5 (3) MG/3ML nebulizer solution 3 mL  3 mL Nebulization TID Rhetta Mura, MD   3 mL at 05/20/18 2043  . metoprolol tartrate (LOPRESSOR) tablet 12.5 mg  12.5 mg Oral BID Opyd, Lavone Neri, MD   12.5 mg at 05/21/18 1100  . nystatin (MYCOSTATIN/NYSTOP) topical powder   Topical BID Rhetta Mura, MD      . ondansetron (ZOFRAN) injection 4 mg  4 mg Intravenous Q6H PRN Opyd, Lavone Neri, MD      . sertraline (ZOLOFT) tablet 50 mg  50 mg Oral Daily Opyd, Lavone Neri, MD   50 mg at 05/21/18 1100  . sodium chloride flush (NS) 0.9 % injection 3 mL  3 mL Intravenous Q12H  Opyd, Lavone Neri, MD   3 mL at 05/21/18 1100  . sodium chloride flush (NS) 0.9 % injection 3 mL  3 mL Intravenous PRN Opyd, Lavone Neri, MD      . spironolactone (ALDACTONE) tablet 12.5 mg  12.5 mg Oral Daily Rhetta Mura, MD   12.5 mg at 05/21/18 1100     Discharge Medications: Please see discharge summary for a list of discharge medications.  Relevant Imaging Results:  Relevant Lab Results:   Additional Information SS# [id number]  Althea Charon, LCSW

## 2018-05-21 NOTE — Progress Notes (Signed)
[facility] PROGRESS NOTE  [name] [id number] DOB: 03-24-1961 DOA: 05/14/2018 PCP: No primary care provider on file.   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 who came in with symptoms of heart failure-she had been seen in the emergency room 12/5 and 04/29/2018 On first visit 12/5 she was seen in the emergency room--had a normal BNP normal troponin chest x-ray however concerning for cardiomegaly vascular congestion and she was given Lasix for 1 week and told to elevate her feet She completed treatment with this but then came back to the hospital as per my partner note  A & Plan  Acute diastolic heart failure-but with differential probable cirrhosis ?diag 2008 with cirrhosis-no further work-up at that time-check PSA G and HCV Continue Lasix 20 IV twice daily, adding small dose of Aldactone to help mobilize further fluid  Tells me that weight over the past year has been 380 and is currently 355 pounds but I suspect she will need diuresis and increase in diuretics over the next several days probably can discharge end of the week New onset diabetes mellitus A1c 7.0-  Continue Amaryl 2 mg g-CBG: 160-220 range Body mass index is 59.98 kg/m.    Life-threatening and needs significant weight loss ?Ohss  habitus with probable restrictive component --is stable at this time Hypertension continue metoprolol 12.5 twice daily-considering addition of other agents Homeless impacting safe discharge plan  Dispo--- awaiting placement to SNF rehab  Lovenox-see above-full code  Plan of care and need for further disposition help-her belongings are still at the homeless shelter and will need to be retrieved if she goes to a skilled facility  [name], MD  [facility] --Via amion app OR  --[email or web address]; password [email or web address]  7PM-7AM contact night coverage as above 05/21/2018, 7:34 PM  LOS: 6 days    Consultants:  No  Procedures:  No  Antimicrobials:  No  Interval history/Subjective: Sign language interpreter used, voiding better, still feels volume overloaded with some shortness of breath   Objective:  Vitals:  Vitals:   05/21/18 1406 05/21/18 1408  BP:  123/73  Pulse:  77  Resp:  16  Temp:  98.9 F (37.2 C)  SpO2: 94% 94%    Exam:   Physical Exam  Patient is examined daily including today on 05/21/18  , exams remain the same as of yesterday except that has changed   Gen:- Awake Alert, obese, in no acute distress HEENT:- [place].AT, No sclera icterus, deaf, uses sign language Neck-Supple Neck,No JVD,.  Lungs-  CTAB  CV- S1, S2 normal Abd-  +ve B.Sounds, Abd Soft, No tenderness,    Extremity/Skin:- No  edema,   pulses Psych-affect is appropriate, oriented x3  Neuro-no new focal deficits, no tremors    Scheduled Meds: . furosemide  20 mg Intravenous BID  . glimepiride  2 mg Oral Q breakfast  . insulin aspart  0-5 Units Subcutaneous QHS  . insulin aspart  0-9 Units Subcutaneous TID WC  . ipratropium-albuterol  3 mL Nebulization TID  . metoprolol tartrate  12.5 mg Oral BID  . nystatin   Topical BID  . sertraline  50 mg Oral Daily  . sodium chloride flush  3 mL Intravenous Q12H  . spironolactone  12.5 mg Oral Daily   Continuous Infusions: . sodium chloride      Principal Problem:   Acute CHF (congestive heart failure) (HCC) Active Problems:   Major depressive disorder, recurrent, severe without psychotic features (HCC)   Hypertension   Mild renal  insufficiency   Hyperglycemia   Heart failure (HCC)   LOS: 6 days

## 2018-05-21 NOTE — Progress Notes (Signed)
CSW following patient for support and discharge needs. CSW requested a PASRR number through the state on behalf of patient. PASRR number at this time has been flagged and is going to [facility] review due to patient history of depression. CSW will continue to follow patient for support and discharge needs. Patient will be unable to discharge without a PASRR number from the [place].   Marrianne Mood, MSW,  [facility]resia Majors [phone]

## 2018-05-22 LAB — GLUCOSE, CAPILLARY
GLUCOSE-CAPILLARY: 220 mg/dL — AB (ref 70–99)
GLUCOSE-CAPILLARY: 140 mg/dL — AB (ref 70–99)
Glucose-Capillary: 157 mg/dL — ABNORMAL HIGH (ref 70–99)

## 2018-05-22 MED ORDER — SENNOSIDES-DOCUSATE SODIUM 8.6-50 MG PO TABS
2.0000 | ORAL_TABLET | Freq: Two times a day (BID) | ORAL | Status: DC
  Administered 2018-05-22 – 2018-05-23 (×2): 2 via ORAL
  Filled 2018-05-22 (×2): qty 2

## 2018-05-22 NOTE — Progress Notes (Signed)
[facility] PROGRESS NOTE  [name] [id number] DOB: 03-15-1961 DOA: 05/14/2018 PCP: No primary care provider on file.   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 who came in with symptoms of heart failure-she had been seen in the emergency room 12/5 and 04/29/2018 On first visit 12/5 she was seen in the emergency room--had a normal BNP normal troponin chest x-ray however concerning for cardiomegaly vascular congestion and she was given Lasix for 1 week and told to elevate her feet She completed treatment with this but then came back to the hospital as per my partner note  As of 05/22/2018 patient is medically stable and awaiting transfer to SNF rehab  A & Plan  Acute diastolic heart failure-but with differential probable cirrhosis ?diag 2008 with cirrhosis-no further work-up at that time-check PSA G and HCV Continue Lasix 20 IV twice daily, adding small dose of Aldactone to help mobilize further fluid  states that weight over the past year has been 380 and is currently 355 pounds -  New onset diabetes mellitus A1c 7.0-  Continue Amaryl 2 mg g-CBG: 160-220 range Body mass index is 59.87 kg/m.    Life-threatening and needs significant weight loss ?Ohss  habitus with probable restrictive component --is stable at this time Hypertension continue metoprolol 12.5 twice daily-considering addition of other agents  Homeless impacting safe discharge plan  Dispo--- awaiting placement to SNF rehab  Lovenox-see above-full code  Plan of care and need for further disposition help-her belongings are still at the homeless shelter and will need to be retrieved if she goes to a skilled facility  [name], MD  [facility] --Via amion app OR  --[email or web address]; password [email or web address]  7PM-7AM contact night coverage as above 05/22/2018, 6:45 PM  LOS: 7 days   Consultants:  No  Procedures:  No  Antimicrobials:  No  Interval history/Subjective: Sign language  interpreter used, voiding better, dyspnea resolving  Objective:  Vitals:  Vitals:   05/22/18 1342 05/22/18 1428  BP: 116/70   Pulse: 83 90  Resp: 15 18  Temp: 98.7 F (37.1 C)   SpO2: 93% 95%    Exam:   Physical Exam  Patient is examined daily including today on 05/22/18  , exams remain the same as of yesterday except that has changed   Gen:- Awake Alert, obese, in no acute distress HEENT:- [place].AT, No sclera icterus, deaf, uses sign language Neck-Supple Neck,No JVD,.  Lungs-  CTAB, fairly symmetrical air movement CV- S1, S2 normal Abd-  +ve B.Sounds, Abd Soft, No tenderness, increased truncal adiposity Extremity/Skin:- No  edema,   pulses Psych-affect is appropriate, oriented x3  Neuro-no new focal deficits, no tremors    Scheduled Meds: . furosemide  20 mg Intravenous BID  . glimepiride  2 mg Oral Q breakfast  . insulin aspart  0-5 Units Subcutaneous QHS  . insulin aspart  0-9 Units Subcutaneous TID WC  . ipratropium-albuterol  3 mL Nebulization TID  . metoprolol tartrate  12.5 mg Oral BID  . nystatin   Topical BID  . senna-docusate  2 tablet Oral BID  . sertraline  50 mg Oral Daily  . sodium chloride flush  3 mL Intravenous Q12H  . spironolactone  12.5 mg Oral Daily   Continuous Infusions: . sodium chloride      Principal Problem:   Acute CHF (congestive heart failure) (HCC) Active Problems:   Major depressive disorder, recurrent, severe without psychotic features (HCC)   Hypertension   Mild renal insufficiency   Hyperglycemia  Heart failure (HCC)   LOS: 7 days

## 2018-05-23 DIAGNOSIS — E1169 Type 2 diabetes mellitus with other specified complication: Secondary | ICD-10-CM

## 2018-05-23 DIAGNOSIS — E119 Type 2 diabetes mellitus without complications: Secondary | ICD-10-CM

## 2018-05-23 LAB — GLUCOSE, CAPILLARY
Glucose-Capillary: 163 mg/dL — ABNORMAL HIGH (ref 70–99)
Glucose-Capillary: 203 mg/dL — ABNORMAL HIGH (ref 70–99)
Glucose-Capillary: 97 mg/dL (ref 70–99)
Glucose-Capillary: 137 mg/dL — ABNORMAL HIGH (ref 70–99)
Glucose-Capillary: 158 mg/dL — ABNORMAL HIGH (ref 70–99)
Glucose-Capillary: 216 mg/dL — ABNORMAL HIGH (ref 70–99)

## 2018-05-23 MED ORDER — SENNOSIDES-DOCUSATE SODIUM 8.6-50 MG PO TABS: 2.0000 | ORAL_TABLET | Freq: Two times a day (BID) | ORAL | 2 refills | Status: AC

## 2018-05-23 MED ORDER — GLIMEPIRIDE 2 MG PO TABS: 2.0000 mg | ORAL_TABLET | Freq: Every day | ORAL | 3 refills | Status: AC

## 2018-05-23 MED ORDER — SPIRONOLACTONE 25 MG PO TABS: 25.0000 mg | ORAL_TABLET | Freq: Every day | ORAL | 2 refills | Status: AC

## 2018-05-23 MED ORDER — INSULIN ASPART 100 UNIT/ML ~~LOC~~ SOLN: SUBCUTANEOUS | 3 refills | Status: AC

## 2018-05-23 MED ORDER — METOPROLOL TARTRATE 25 MG PO TABS: 12.5000 mg | ORAL_TABLET | Freq: Two times a day (BID) | ORAL | 2 refills | Status: AC

## 2018-05-23 MED ORDER — GUAIFENESIN ER 600 MG PO TB12: 600.0000 mg | ORAL_TABLET | Freq: Two times a day (BID) | ORAL | 0 refills | Status: AC

## 2018-05-23 MED ORDER — FUROSEMIDE 40 MG PO TABS: 40.0000 mg | ORAL_TABLET | Freq: Every day | ORAL | 0 refills | Status: AC

## 2018-05-23 MED ORDER — NYSTATIN 100000 UNIT/GM EX POWD: Freq: Two times a day (BID) | CUTANEOUS | 0 refills | Status: AC

## 2018-05-23 MED ORDER — GUAIFENESIN 100 MG/5ML PO SOLN: 5.0000 mL | ORAL | 0 refills | Status: AC | PRN

## 2018-05-23 MED ORDER — METFORMIN HCL 500 MG PO TABS: 500.0000 mg | ORAL_TABLET | Freq: Two times a day (BID) | ORAL | 11 refills | Status: AC

## 2018-05-23 MED ORDER — ALBUTEROL SULFATE (2.5 MG/3ML) 0.083% IN NEBU: 2.5000 mg | INHALATION_SOLUTION | RESPIRATORY_TRACT | 12 refills | Status: AC | PRN

## 2018-05-23 NOTE — Care Management Important Message (Signed)
Important Message  Patient Details  Name: [name] MRN: [id number] Date of Birth: 03-29-1961   Medicare Important Message Given:  Yes    Caren Macadam 05/23/2018, 1:18 PMImportant Message  Patient Details  Name: [name] MRN: [id number] Date of Birth: [birth year]   Medicare Important Message Given:  Yes    Caren Macadam 05/23/2018, 1:18 PM

## 2018-05-23 NOTE — Discharge Instructions (Signed)
1)Very low-salt diet advised 2)Weigh yourself daily, call if you gain more than 3 pounds in 1 day or more than 5 pounds in 1 week as your diuretic medications may need to be adjusted 3)Limit your Fluid  intake to no more than 60 ounces (1.8 Liters) per day 4)insulin aspart (novoLOG) injection 0-5 Units 0-5 Units, Subcutaneous,  Correction coverage: Sliding scale scale CBG < 70: implement hypoglycemia protocol CBG 70 - 120: 0 units CBG 121 - 150: 0 units CBG 151 - 200: 0 units CBG 201 - 250: 2 units CBG 251 - 300: 3 units CBG 301 - 350: 4 units CBG 351 - 400: 5 units CBG > 400: call MD   5)Repeat CBC and BMP on Monday 05/26/2018 6)Oxygen Via [place] at 2L/min to Keep Oxygen saturations above 92 % 7) patient is legally Deaf-----patient understands and is fluent in sign language    Heart Failure  Heart failure means your heart has trouble pumping blood. This makes it hard for your body to work well. Heart failure is usually a long-term (chronic) condition. You must take good care of yourself and follow your treatment plan from your doctor. Follow these instructions at home: Medicines  Take over-the-counter and prescription medicines only as told by your doctor. ? Do not stop taking your medicine unless your doctor told you to do that. ? Do not skip any doses. ? Refill your prescriptions before you run out of medicine. You need your medicines every day. Eating and drinking   Eat heart-healthy foods. Talk with a diet and nutrition specialist (dietitian) to make an eating plan.  Choose foods that: ? Have no trans fat. ? Are low in saturated fat and cholesterol.  Choose healthy foods, like: ? Fresh or frozen fruits and vegetables. ? Fish. ? Low-fat (lean) meats. ? Legumes (like beans, peas, and lentils). ? Fat-free or low-fat dairy products. ? Whole-grain foods. ? High-fiber foods.  Limit salt (sodium) if told by your doctor. Ask your nutrition specialist to recommend  heart-healthy seasonings.  Cook in healthy ways instead of frying. Healthy ways of cooking include: ? Roasting. ? Grilling. ? Broiling. ? Baking. ? Poaching. ? Steaming. ? Stir-frying.  Limit how much fluid you drink, if told by your doctor. Lifestyle  Do not smoke or use chewing tobacco. Do not use nicotine gum or patches before talking to your doctor.  Limit alcohol intake to no more than 1 drink a day for non-pregnant women and 2 drinks a day for men. One drink equals 12 oz of beer, 5 oz of wine, or 1 oz of hard liquor. ? Tell your doctor if you drink alcohol many times a week. ? Talk with your doctor about whether any alcohol is safe for you. ? You should stop drinking alcohol: ? If your heart has been damaged by alcohol. ? You have very bad heart failure.  Do not use illegal drugs.  Lose weight if told by your doctor.  Do moderate physical activity if told by your doctor. Ask your doctor what activities are safe for you if: ? You are of older age (elderly). ? You have very bad heart failure. Keep track of important information  Weigh yourself every day. ? Weigh yourself every morning after you pee (urinate) and before breakfast. ? Wear the same amount of clothing each time. ? Write down your daily weight. Give your record to your doctor.  Check and write down your blood pressure as told by your doctor.  Check your  pulse as told by your doctor. Dealing with heat and cold  If the weather is very hot: ? Avoid activity that takes a lot of energy. ? Use air conditioning or fans, or find a cooler place. ? Avoid caffeine. ? Avoid alcohol. ? Wear clothing that is loose-fitting, lightweight, and light-colored.  If the weather is very cold: ? Avoid activity that takes a lot of energy. ? Layer your clothes. ? Wear mittens or gloves, a hat, and a scarf when you go outside. ? Avoid alcohol. General instructions  Manage other conditions that you have as told by your  doctor.  Learn to manage stress. If you need help, ask your doctor.  Plan rest periods for when you get tired.  Get education and support as needed.  Get rehab (rehabilitation) to help you stay independent and to help with everyday tasks.  Stay up to date with shots (immunizations), especially pneumococcal and flu (influenza) shots.  Keep all follow-up visits as told by your doctor. This is important. Contact a doctor if:  You gain weight quickly.  You are more short of breath than normal.  You cannot do your normal activities.  You tire easily.  You cough more than normal, especially with activity.  You have any or more puffiness (swelling) in areas such as your hands, feet, ankles, or belly (abdomen).  You cannot sleep because it is hard to breathe.  You feel like your heart is beating fast (palpitations).  You get dizzy or light-headed when you stand up. Get help right away if:  You have trouble breathing.  You or someone else notices a change in your awareness. This could be trouble staying awake or trouble concentrating.  You have chest pain or discomfort.  You pass out (faint). Summary  Heart failure means your heart has trouble pumping blood.  Make sure you refill your prescriptions before you run out of medicine. You need your medicines every day.  Keep records of your weight and blood pressure to give to your doctor.  Contact a doctor if you gain weight quickly. This information is not intended to replace advice given to you by your health care provider. Make sure you discuss any questions you have with your health care provider. Document Released: 02/07/2008 Document Revised: 01/22/2018 Document Reviewed: 05/22/2016 Elsevier Interactive Patient Education  2019 [employer].    1)Very low-salt diet advised 2)Weigh yourself daily, call if you gain more than 3 pounds in 1 day or more than 5 pounds in 1 week as your diuretic medications may need to be  adjusted 3)Limit your Fluid  intake to no more than 60 ounces (1.8 Liters) per day 4)insulin aspart (novoLOG) injection 0-5 Units 0-5 Units, Subcutaneous,  Correction coverage: Sliding scale scale CBG < 70: implement hypoglycemia protocol CBG 70 - 120: 0 units CBG 121 - 150: 0 units CBG 151 - 200: 0 units CBG 201 - 250: 2 units CBG 251 - 300: 3 units CBG 301 - 350: 4 units CBG 351 - 400: 5 units CBG > 400: call MD   5)Repeat CBC and BMP on Monday 05/26/2018 6)Oxygen Via [place] at 2L/min to Keep Oxygen saturations above 92 % 7) patient is legally Deaf-----patient understands and is fluent in sign language

## 2018-05-23 NOTE — Progress Notes (Signed)
Clinical Social Worker facilitated patient discharge including contacting patient family and facility to confirm patient discharge plans.  Clinical information faxed to facility and family agreeable with plan.  CSW arranged ambulance transport via [facility] to [address] of [place] .  RN to call [phone] (rm# [id number]) for report prior to discharge.  Clinical Social Worker will sign off for now as social work intervention is no longer needed. Please consult [place] again if new need arises.  Marrianne Mood, MSW, [employer] [phone]

## 2018-05-23 NOTE — Progress Notes (Signed)
Nurse report called to [facility] - Joy,RN

## 2018-05-23 NOTE — Discharge Summary (Addendum)
[name], is a 58 y.o. female  DOB [birth year]  MRN [id number].  Admission date:  05/14/2018  Admitting Physician  Briscoe Deutscher, MD  Discharge Date:  05/23/2018   Primary MD  No primary care provider on file.  Recommendations for primary care physician for things to follow:   1)Very low-salt diet advised 2)Weigh yourself daily, call if you gain more than 3 pounds in 1 day or more than 5 pounds in 1 week as your diuretic medications may need to be adjusted 3)Limit your Fluid  intake to no more than 60 ounces (1.8 Liters) per day 4)insulin aspart (novoLOG) injection 0-5 Units 0-5 Units, Subcutaneous,  Correction coverage: Sliding scale scale CBG < 70: implement hypoglycemia protocol CBG 70 - 120: 0 units CBG 121 - 150: 0 units CBG 151 - 200: 0 units CBG 201 - 250: 2 units CBG 251 - 300: 3 units CBG 301 - 350: 4 units CBG 351 - 400: 5 units CBG > 400: call MD   5)Repeat CBC and BMP on Monday 05/26/2018 6)Oxygen Via [place] at 2L/min to Keep Oxygen saturations above 92 % 7) patient is legally Deaf-----patient understands and is fluent in sign language    Admission Diagnosis  SOB   Discharge Diagnosis  SOB    Principal Problem:   Acute CHF (congestive heart failure) (HCC) Active Problems:   Major depressive disorder, recurrent, severe without psychotic features (HCC)   Hypertension   Mild renal insufficiency   Hyperglycemia   Heart failure (HCC)   DM 2 (diabetes mellitus) (HCC)      Past Medical History:  Diagnosis Date  . Anxiety   . Arthritis    right hand and knee  . Chronic pain of right knee   . Deaf   . Depression   . Hypertension     Past Surgical History:  Procedure Laterality Date  . ABDOMINAL HYSTERECTOMY  2008  . CHOLECYSTECTOMY    . HERNIA REPAIR     ? umbicical  . KNEE ARTHROSCOPY Right 04/13/2015   Procedure: RIGHT KNEE ARTHROSCOPY,Partial Medial Meniscectomy,  Chondroplasty Medial Lateral, Medial Plica Excision;  Surgeon: Jodi Geralds, MD;  Location: [facility] OR;  Service: Orthopedics;  Laterality: Right;  . UTERINE FIBROID SURGERY    . WISDOM TOOTH EXTRACTION     patient denies    HPI  from the history and physical done on the day of admission:    Patient coming from: Homeless shelter   Chief Complaint: SOB  HPI: [name] is a 58 y.o. female with medical history significant for hearing impairment, depression, hypertension, and bilateral lower extremity edema treated with a course of Lasix recently, now presenting to the emergency department with the insidious development of shortness of breath over the past 2 days, worsening acutely overnight and prompting her to call EMS.  She had been seen in the emergency department approximately 1 month ago for progressive edema involving the bilateral lower legs, was prescribed a course of Lasix and reports improvement in the swelling at that  time, but over the past 2 days has become increasingly short of breath, worse with any exertion, but was having difficulty catching her breath at rest overnight and called EMS.  She denies any chest pain, palpitations, reports a mild nonproductive cough, denies any significant rhinorrhea or sore throat, and has not appreciated any fever or chills.  She does not have history of smoking, asthma, or COPD.  She was treated with 125 mg of IV Solu-Medrol and albuterol neb prior to arrival in the ED.  ED Course: Upon arrival to the ED, patient is found to be afebrile, saturating low 90s on room air, and with vitals otherwise stable.  EKG features a sinus rhythm and chest x-ray is notable for cardiomegaly with vascular congestion and streaky atelectasis versus bronchitic changes at the bases.  Chemistry panel is notable for a potassium of 3.4, glucose 255, and creatinine 1.26, similar to priors.  CBC is unremarkable, influenza PCR is negative, troponin undetectable, and BNP is normal.   Patient was given 40 mg IV Lasix and albuterol neb in the ED.  She reports some slight improvement with this, but remains short of breath at rest and will be observed for further evaluation and management of dyspnea suspected secondary to CHF.   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 who came in with symptoms of heart failure-she had been seen in the emergency room 12/5 and 04/29/2018 On first visit 12/5 she was seen in the emergency room--had a normal BNP normal troponin chest x-ray however concerning for cardiomegaly vascular congestion and she was given Lasix for 1 week and told to elevate her feet She completed treatment with this but then came back to the hospital as per my partner note  Transfer to SNF rehab  A & Plan  1)HFpEF--- history of chronic diastolic dysfunction CHF with acute exacerbation --last known EF 65 to 70%--- volume status has improved with diuresis, renal function electrolytes appears stable with IV diuresis, okay to discharge on Lasix 40 mg daily along with Aldactone 25 mg daily,, BMP on Monday, 05/26/2018,, very low-salt diet, and frequent weight check advised  2) ?? Diag in 2008 with cirrhosis-no further work-up at that time-acute viral potassium supplement including hep B and ampicillin are negative, prior abdominal imaging studies suggest fatty liver, TSH is normal--- avoid hepatotoxic agents, patient currently weighs over 158 kg with a BMI of 59 weight loss will be beneficial--- diuretics as above #1  3)New onset diabetes mellitus---- A1c 7.2, Continue Amaryl 2 mg with breakfast and metformin 500 twice daily , may use additional sliding scale insulin  4)Morbid Obesity ---body mass index is 59.87 kg/m--- complicates overall care, aggressive weight loss program advised         5)Possible OSA/Possible OHSS--- given body habitus, patient most likely has significant restrictive component------, aggressive weight loss program as  advised above #4, outpatient sleep study  6)Hypertension----stable, continue metoprolol 12.5 twice daily-, Lasix and Aldactone as ordered   Disposition ----prior to admission patient was in a homeless shelter , okay to discharge to SNF Rehab   Discharge Condition: stable  Follow UP  Contact information for after-discharge care    Destination    [facility] .   Service:  Skilled Nursing Contact information: [address] [place] [address] [phone]              Diet and Activity recommendation:  As advised  Discharge Instructions    Discharge  Instructions    Call MD for:  difficulty breathing, headache or visual disturbances   Complete by:  As directed    Call MD for:  persistant dizziness or light-headedness   Complete by:  As directed    Call MD for:  persistant nausea and vomiting   Complete by:  As directed    Call MD for:  severe uncontrolled pain   Complete by:  As directed    Call MD for:  temperature >100.4   Complete by:  As directed    Diet - low sodium heart healthy   Complete by:  As directed    Diet Carb Modified   Complete by:  As directed    Discharge instructions   Complete by:  As directed    1)Very low-salt diet advised 2)Weigh yourself daily, call if you gain more than 3 pounds in 1 day or more than 5 pounds in 1 week as your diuretic medications may need to be adjusted 3)Limit your Fluid  intake to no more than 60 ounces (1.8 Liters) per day 4)insulin aspart (novoLOG) injection 0-5 Units 0-5 Units, Subcutaneous,  Correction coverage: Sliding scale scale CBG < 70: implement hypoglycemia protocol CBG 70 - 120: 0 units CBG 121 - 150: 0 units CBG 151 - 200: 0 units CBG 201 - 250: 2 units CBG 251 - 300: 3 units CBG 301 - 350: 4 units CBG 351 - 400: 5 units CBG > 400: call MD   5)Repeat CBC and BMP on Monday 05/26/2018 6)Oxygen Via  at 2L/min to Keep Oxygen saturations above 92 %  7) patient  is legally Deaf-----patient understands and is fluent in sign language   Walk with assistance   Complete by:  As directed        Discharge Medications     Allergies as of 05/23/2018      Reactions   Penicillins Rash   Has patient had a PCN reaction causing immediate rash, facial/tongue/throat swelling, SOB or lightheadedness with hypotension: Unknown Has patient had a PCN reaction causing severe rash involving mucus membranes or skin necrosis: Unknown Has patient had a PCN reaction that required hospitalization: Unknown Has patient had a PCN reaction occurring within the last 10 years: No If all of the above answers are "NO", then may proceed with Cephalosporin use.      Medication List    TAKE these medications   acetaminophen 500 MG tablet Commonly known as:  TYLENOL Take 500 mg by mouth every 6 (six) hours as needed for mild pain.   albuterol (2.5 MG/3ML) 0.083% nebulizer solution Commonly known as:  PROVENTIL Take 3 mLs (2.5 mg total) by nebulization every 4 (four) hours as needed for wheezing or shortness of breath.   furosemide 40 MG tablet Commonly known as:  LASIX Take 1 tablet (40 mg total) by mouth daily with breakfast. What changed:  when to take this   glimepiride 2 MG tablet Commonly known as:  AMARYL Take 1 tablet (2 mg total) by mouth daily with breakfast. Start taking on:  May 24, 2018   guaiFENesin 100 MG/5ML Soln Commonly known as:  ROBITUSSIN Take 5 mLs (100 mg total) by mouth every 4 (four) hours as needed for cough or to loosen phlegm. Notes to patient:  Last dose: 1/10 1230PM   guaiFENesin 600 MG 12 hr tablet Commonly known as:  MUCINEX Take 1 tablet (600 mg total) by mouth 2 (two) times daily for 10 days.   insulin aspart 100 UNIT/ML  injection Commonly known as:  NOVOLOG Use as per sliding scale insulin aspart (novoLOG) injection 0-5 Units 0-5 Units, Subcutaneous,  Correction coverage: Sliding scale scale CBG < 70: implement  hypoglycemia protocol CBG 70 - 120: 0 units CBG 121 - 150: 0 units CBG 151 - 200: 0 units CBG 201 - 250: 2 units CBG 251 - 300: 3 units CBG 301 - 350: 4 units CBG 351 - 400: 5 units CBG > 400: call MD   metFORMIN 500 MG tablet Commonly known as:  GLUCOPHAGE Take 1 tablet (500 mg total) by mouth 2 (two) times daily with a meal.   metoprolol tartrate 25 MG tablet Commonly known as:  LOPRESSOR Take 0.5 tablets (12.5 mg total) by mouth 2 (two) times daily.   nystatin powder Commonly known as:  MYCOSTATIN/NYSTOP Apply topically 2 (two) times daily.   senna-docusate 8.6-50 MG tablet Commonly known as:  Senokot-S Take 2 tablets by mouth 2 (two) times daily.   sertraline 50 MG tablet Commonly known as:  ZOLOFT Take 1 tablet (50 mg total) by mouth daily.   spironolactone 25 MG tablet Commonly known as:  ALDACTONE Take 1 tablet (25 mg total) by mouth daily with breakfast.      Major procedures and Radiology Reports - PLEASE review detailed and final reports for all details, in brief -   Dg Chest 2 View  Result Date: 05/19/2018 CLINICAL DATA:  CHF EXAM: CHEST - 2 VIEW COMPARISON:  Two days ago FINDINGS: Stable cardiomegaly. Stable vascular pedicle widening. Stable interstitial prominence accentuated by low volumes. No Kerley lines, effusion, or pneumothorax. IMPRESSION: Unchanged cardiomegaly and vascular congestion. Electronically Signed   By: Marnee SpringJonathon  Watts M.D.   On: 05/19/2018 11:17   Dg Chest 2 View  Result Date: 05/17/2018 CLINICAL DATA:  Productive cough. EXAM: CHEST - 2 VIEW COMPARISON:  PA and lateral chest 12/02/2014, 04/18/2018 and 05/14/2018. FINDINGS: There is cardiomegaly and vascular congestion. No consolidative process, pneumothorax or effusion. No acute or focal bony abnormality. IMPRESSION: Cardiomegaly and pulmonary vascular congestion. Electronically Signed   By: Drusilla Kannerhomas  Dalessio M.D.   On: 05/17/2018 12:12   Dg Chest 2 View  Result Date: 05/14/2018 CLINICAL  DATA:  Shortness of breath EXAM: CHEST - 2 VIEW COMPARISON:  04/18/2018 FINDINGS: No pleural effusion. Mild cardiomegaly with central vascular congestion. Streaky atelectasis at the bases. No pneumothorax. Degenerative changes of the spine. IMPRESSION: 1. Cardiomegaly with vascular congestion 2. Streaky atelectasis or bronchitic changes at both bases. Electronically Signed   By: Jasmine PangKim  Fujinaga M.D.   On: 05/14/2018 23:07   Today   Subjective    [name] today has no new complains, shortness of breath, cough and congestion continues to improve, no fevers, no chills, no frank chest pains--- voiding well, last BM 05/22/2018          Patient has been seen and examined prior to discharge   Objective   Blood pressure 135/74, pulse 80, temperature 98.6 F (37 C), temperature source Oral, resp. rate 20, height 5\' 4"  (1.626 m), weight (!) 158.2 kg, SpO2 91 %.   Intake/Output Summary (Last 24 hours) at 05/23/2018 1250 Last data filed at 05/23/2018 1030 Gross per 24 hour  Intake 1260 ml  Output 1200 ml  Net 60 ml    Exam  Physical Exam    Patient is examined daily including today on 05/23/18 , exams remain the same as of yesterday except that has changed     Gen:- Awake Alert, obese, in no  acute distress  HEENT:- [place].AT, No sclera icterus, deaf, uses sign language  Neck-Supple Neck,No JVD,.   Lungs-  CTAB, fairly symmetrical air movement  CV- S1, S2 normal  Abd-  +ve B.Sounds, Abd Soft, No tenderness, increased truncal adiposity  Extremity/Skin:- No  edema,   pulses  Psych-affect is appropriate, oriented x3  Neuro-no new focal deficits, no tremors   Data Review   CBC w Diff:  Lab Results  Component Value Date   WBC 9.5 05/21/2018   HGB 12.7 05/21/2018   HCT 42.2 05/21/2018   PLT 302 05/21/2018   LYMPHOPCT 14 05/21/2018   MONOPCT 14 05/21/2018   EOSPCT 1 05/21/2018   BASOPCT 0 05/21/2018    CMP:  Lab Results  Component Value Date   NA 137 05/21/2018   K  3.6 05/21/2018   CL 96 (L) 05/21/2018   CO2 30 05/21/2018   BUN 23 (H) 05/21/2018   CREATININE 1.10 (H) 05/21/2018   CREATININE 1.03 03/02/2014   PROT 7.5 05/21/2018   ALBUMIN 3.3 (L) 05/21/2018   BILITOT 0.7 05/21/2018   ALKPHOS 66 05/21/2018   AST 11 (L) 05/21/2018   ALT 14 05/21/2018  .   Total Discharge time is about 33 minutes  [name] M.D on 05/23/2018 at 12:50 PM  Go to [email or web address] -  for contact info  [facility] - Office  [phone]

## 2018-05-23 NOTE — Clinical Social Work Placement (Signed)
   CLINICAL SOCIAL WORK PLACEMENT  NOTE  Date:  05/23/2018  Patient Details  Name: [name] MRN: [id number] Date of Birth: 28-Feb-1961  Clinical Social Work is seeking post-discharge placement for this patient at the Skilled  Nursing Facility level of care (*CSW will initial, date and re-position this form in  chart as items are completed):  Yes   Patient/family provided with [place] Clinical Social Work Department's list of facilities offering this level of care within the geographic area requested by the patient (or if unable, by the patient's family).      Patient/family informed of their freedom to choose among providers that offer the needed level of care, that participate in Medicare, Medicaid or managed care program needed by the patient, have an available bed and are willing to accept the patient.  Yes   Patient/family informed of [facility]'s ownership interest in [facility] and [facility], as well as of the fact that they are under no obligation to receive care at these facilities.  PASRR submitted to EDS on       PASRR number received on       Existing PASRR number confirmed on 05/23/18     FL2 transmitted to all facilities in geographic area requested by pt/family on 05/23/18     FL2 transmitted to all facilities within larger geographic area on       Patient informed that his/her managed care company has contracts with or will negotiate with certain facilities, including the following:            Patient/family informed of bed offers received.  Patient chooses bed at [facility])     Physician recommends and patient chooses bed at      [facility] be [facility] on 05/23/18.  Patient to be transferred to facility by [facility]     Patient family notified on 05/23/18 of transfer.  Name of family member notified:  spoke with pt daughter     PHYSICIAN       Additional Comment:     _______________________________________________ Althea Charon, LCSW 05/23/2018, 11:57 AM

## 2019-08-03 IMAGING — CR DG CHEST 2V
2 series · 2 of 2 positions shown · non-contrast
Comparison: 12/02/2014

CLINICAL DATA: Cough

EXAM:
CHEST - 2 VIEW

[w chest lat]
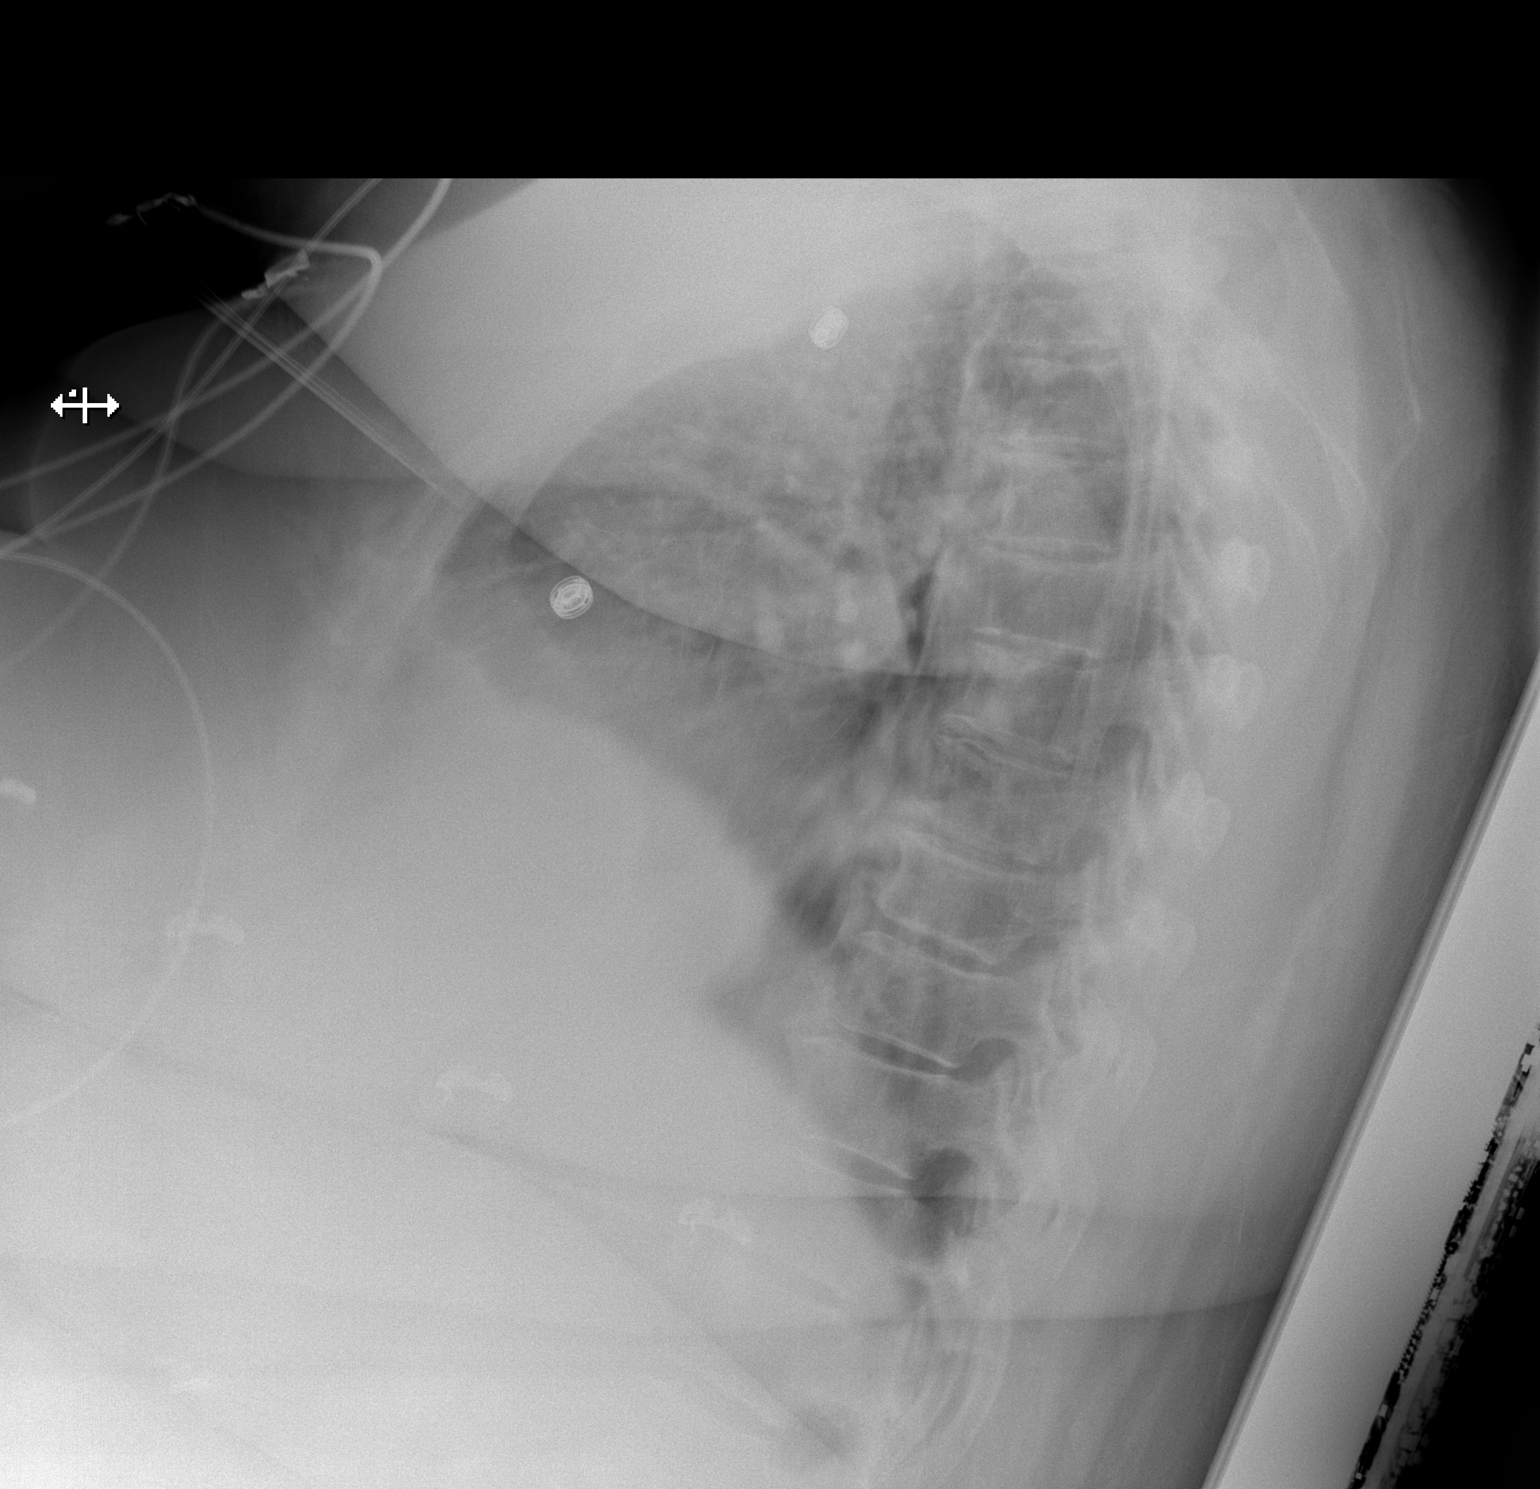

[x chest ap]
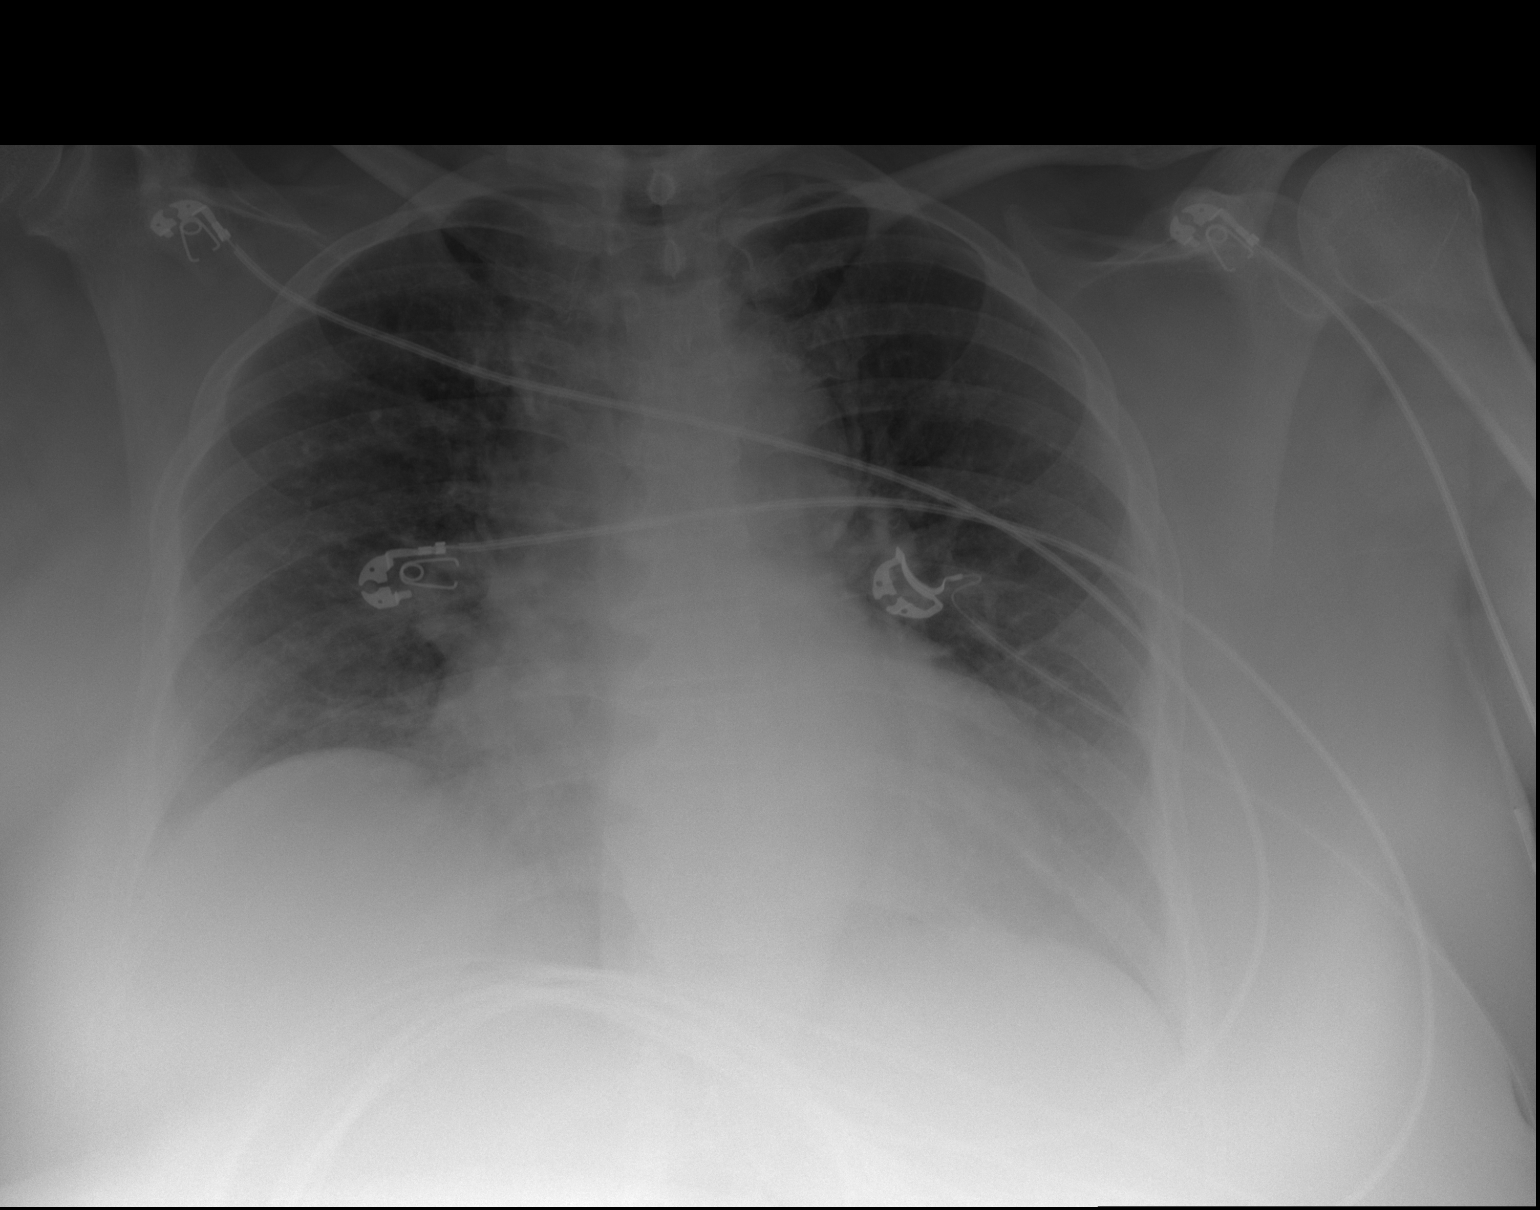

[2 of 2 positions shown; findings below may reference images not displayed]

FINDINGS: Cardiomegaly with vascular congestion. No confluent airspace
opacities or effusions. No overt edema. No acute bony abnormality.
IMPRESSION: Cardiomegaly, vascular congestion.

## 2019-08-29 IMAGING — CR DG CHEST 2V
2 series · 2 of 2 positions shown · non-contrast
Comparison: 04/18/2018

CLINICAL DATA: Shortness of breath

EXAM:
CHEST - 2 VIEW

[w chest lat]
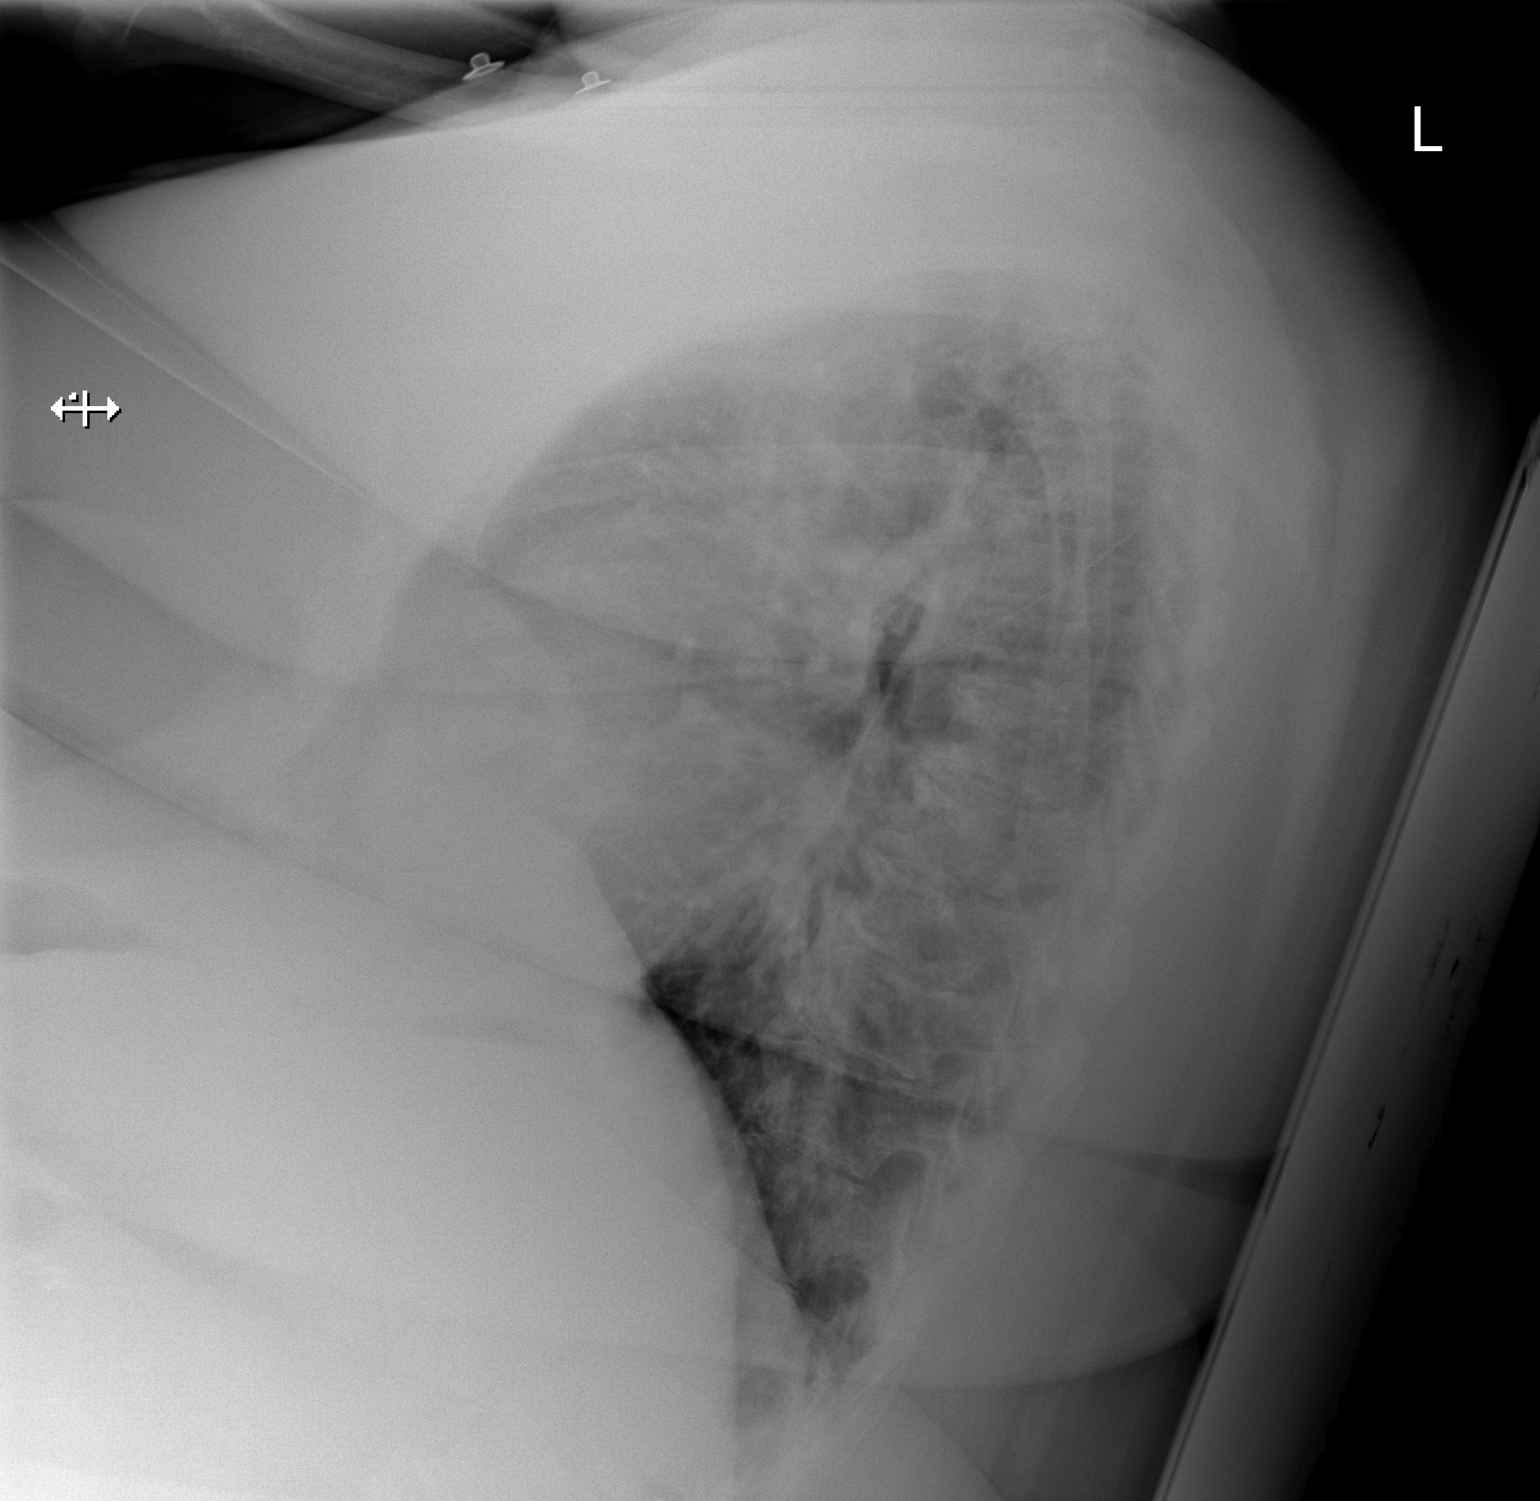

[x chest ap]
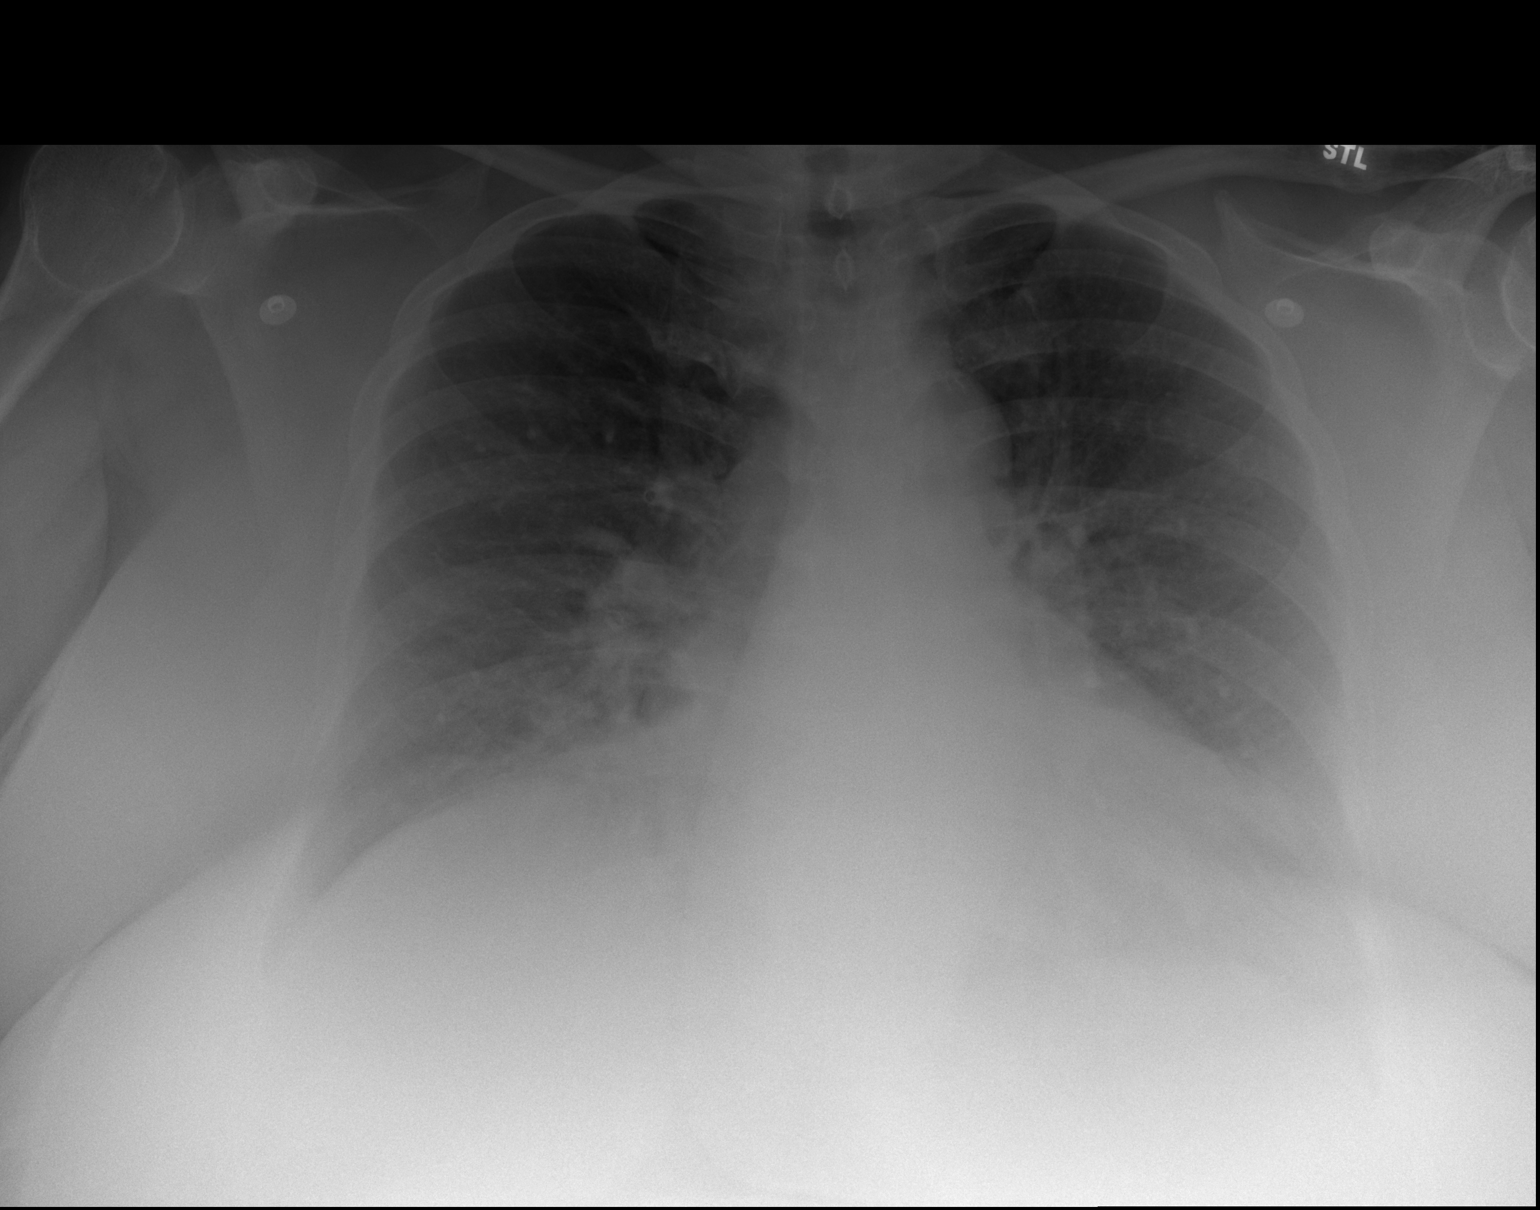

[2 of 2 positions shown; findings below may reference images not displayed]

FINDINGS: No pleural effusion. Mild cardiomegaly with central vascular
congestion. Streaky atelectasis at the bases. No pneumothorax.
Degenerative changes of the spine.
IMPRESSION: 1. Cardiomegaly with vascular congestion
2. Streaky atelectasis or bronchitic changes at both bases.
